# Patient Record
Sex: Male | Born: 1940 | Race: Black or African American | Hispanic: No | Smoking: Never smoker
Health system: Southern US, Community
[De-identification: ages and names within clinical notes are randomized; demographics above are authoritative.]

## PROBLEM LIST (undated history)

## (undated) DIAGNOSIS — E119 Type 2 diabetes mellitus without complications: Secondary | ICD-10-CM

## (undated) DIAGNOSIS — Z8546 Personal history of malignant neoplasm of prostate: Secondary | ICD-10-CM

## (undated) DIAGNOSIS — M549 Dorsalgia, unspecified: Secondary | ICD-10-CM

## (undated) DIAGNOSIS — I421 Obstructive hypertrophic cardiomyopathy: Secondary | ICD-10-CM

## (undated) DIAGNOSIS — I639 Cerebral infarction, unspecified: Secondary | ICD-10-CM

## (undated) DIAGNOSIS — I10 Essential (primary) hypertension: Secondary | ICD-10-CM

## (undated) DIAGNOSIS — L89159 Pressure ulcer of sacral region, unspecified stage: Secondary | ICD-10-CM

## (undated) DIAGNOSIS — F0390 Unspecified dementia without behavioral disturbance: Secondary | ICD-10-CM

## (undated) DIAGNOSIS — C801 Malignant (primary) neoplasm, unspecified: Secondary | ICD-10-CM

## (undated) DIAGNOSIS — J45909 Unspecified asthma, uncomplicated: Secondary | ICD-10-CM

## (undated) DIAGNOSIS — G8929 Other chronic pain: Secondary | ICD-10-CM

## (undated) HISTORY — DX: Pressure ulcer of sacral region, unspecified stage: L89.159

---

## 2013-02-15 DIAGNOSIS — I639 Cerebral infarction, unspecified: Secondary | ICD-10-CM

## 2013-02-15 HISTORY — DX: Cerebral infarction, unspecified: I63.9

## 2013-04-03 ENCOUNTER — Inpatient Hospital Stay (HOSPITAL_COMMUNITY)
Admission: EM | Admit: 2013-04-03 | Discharge: 2013-04-08 | DRG: 690 | Disposition: A | Discharge status: Skilled Nursing Facility | Payer: Medicare Other | Attending: Internal Medicine | Admitting: Internal Medicine

## 2013-04-03 ENCOUNTER — Emergency Department (HOSPITAL_COMMUNITY): Payer: Medicare Other

## 2013-04-03 ENCOUNTER — Encounter (HOSPITAL_COMMUNITY): Payer: Self-pay | Admitting: Emergency Medicine

## 2013-04-03 ENCOUNTER — Emergency Department (HOSPITAL_COMMUNITY)
Admission: EM | Admit: 2013-04-03 | Discharge: 2013-04-03 | Discharge status: Absent without leave | Payer: Medicare Other | Source: Home / Self Care | Attending: Emergency Medicine | Admitting: Emergency Medicine

## 2013-04-03 DIAGNOSIS — I10 Essential (primary) hypertension: Secondary | ICD-10-CM | POA: Diagnosis present

## 2013-04-03 DIAGNOSIS — N390 Urinary tract infection, site not specified: Principal | ICD-10-CM | POA: Diagnosis present

## 2013-04-03 DIAGNOSIS — M25569 Pain in unspecified knee: Secondary | ICD-10-CM | POA: Diagnosis present

## 2013-04-03 DIAGNOSIS — Z79899 Other long term (current) drug therapy: Secondary | ICD-10-CM

## 2013-04-03 DIAGNOSIS — F0390 Unspecified dementia without behavioral disturbance: Secondary | ICD-10-CM | POA: Diagnosis present

## 2013-04-03 DIAGNOSIS — M160 Bilateral primary osteoarthritis of hip: Secondary | ICD-10-CM

## 2013-04-03 DIAGNOSIS — R011 Cardiac murmur, unspecified: Secondary | ICD-10-CM | POA: Diagnosis present

## 2013-04-03 DIAGNOSIS — R32 Unspecified urinary incontinence: Secondary | ICD-10-CM | POA: Diagnosis present

## 2013-04-03 DIAGNOSIS — E860 Dehydration: Secondary | ICD-10-CM | POA: Diagnosis present

## 2013-04-03 DIAGNOSIS — I69920 Aphasia following unspecified cerebrovascular disease: Secondary | ICD-10-CM

## 2013-04-03 DIAGNOSIS — Z23 Encounter for immunization: Secondary | ICD-10-CM

## 2013-04-03 DIAGNOSIS — E876 Hypokalemia: Secondary | ICD-10-CM

## 2013-04-03 DIAGNOSIS — B9689 Other specified bacterial agents as the cause of diseases classified elsewhere: Secondary | ICD-10-CM | POA: Diagnosis present

## 2013-04-03 DIAGNOSIS — Z794 Long term (current) use of insulin: Secondary | ICD-10-CM

## 2013-04-03 DIAGNOSIS — Z8673 Personal history of transient ischemic attack (TIA), and cerebral infarction without residual deficits: Secondary | ICD-10-CM

## 2013-04-03 DIAGNOSIS — E119 Type 2 diabetes mellitus without complications: Secondary | ICD-10-CM | POA: Diagnosis present

## 2013-04-03 DIAGNOSIS — H18419 Arcus senilis, unspecified eye: Secondary | ICD-10-CM | POA: Diagnosis present

## 2013-04-03 DIAGNOSIS — J45909 Unspecified asthma, uncomplicated: Secondary | ICD-10-CM | POA: Diagnosis present

## 2013-04-03 DIAGNOSIS — Z7982 Long term (current) use of aspirin: Secondary | ICD-10-CM

## 2013-04-03 DIAGNOSIS — Z8546 Personal history of malignant neoplasm of prostate: Secondary | ICD-10-CM

## 2013-04-03 DIAGNOSIS — E7800 Pure hypercholesterolemia, unspecified: Secondary | ICD-10-CM | POA: Diagnosis present

## 2013-04-03 DIAGNOSIS — B351 Tinea unguium: Secondary | ICD-10-CM | POA: Diagnosis present

## 2013-04-03 DIAGNOSIS — Z7902 Long term (current) use of antithrombotics/antiplatelets: Secondary | ICD-10-CM

## 2013-04-03 DIAGNOSIS — M169 Osteoarthritis of hip, unspecified: Secondary | ICD-10-CM | POA: Diagnosis present

## 2013-04-03 DIAGNOSIS — M1610 Unilateral primary osteoarthritis, unspecified hip: Secondary | ICD-10-CM | POA: Diagnosis present

## 2013-04-03 DIAGNOSIS — I872 Venous insufficiency (chronic) (peripheral): Secondary | ICD-10-CM | POA: Diagnosis present

## 2013-04-03 DIAGNOSIS — E785 Hyperlipidemia, unspecified: Secondary | ICD-10-CM | POA: Diagnosis present

## 2013-04-03 DIAGNOSIS — R4182 Altered mental status, unspecified: Secondary | ICD-10-CM

## 2013-04-03 DIAGNOSIS — R269 Unspecified abnormalities of gait and mobility: Secondary | ICD-10-CM | POA: Diagnosis present

## 2013-04-03 HISTORY — DX: Personal history of malignant neoplasm of prostate: Z85.46

## 2013-04-03 HISTORY — DX: Dorsalgia, unspecified: M54.9

## 2013-04-03 HISTORY — DX: Essential (primary) hypertension: I10

## 2013-04-03 HISTORY — DX: Obstructive hypertrophic cardiomyopathy: I42.1

## 2013-04-03 HISTORY — DX: Unspecified dementia, unspecified severity, without behavioral disturbance, psychotic disturbance, mood disturbance, and anxiety: F03.90

## 2013-04-03 HISTORY — DX: Unspecified asthma, uncomplicated: J45.909

## 2013-04-03 HISTORY — DX: Cerebral infarction, unspecified: I63.9

## 2013-04-03 HISTORY — DX: Other chronic pain: G89.29

## 2013-04-03 HISTORY — DX: Type 2 diabetes mellitus without complications: E11.9

## 2013-04-03 LAB — URINE MICROSCOPIC-ADD ON

## 2013-04-03 LAB — URINALYSIS, ROUTINE W REFLEX MICROSCOPIC
Bilirubin Urine: NEGATIVE
Glucose, UA: 100 mg/dL — AB
Glucose, UA: NEGATIVE mg/dL
Hgb urine dipstick: NEGATIVE
Protein, ur: NEGATIVE mg/dL
Specific Gravity, Urine: 1.022 (ref 1.005–1.030)
pH: 8.5 — ABNORMAL HIGH (ref 5.0–8.0)

## 2013-04-03 LAB — CBC WITH DIFFERENTIAL/PLATELET
Basophils Absolute: 0 10*3/uL (ref 0.0–0.1)
Lymphocytes Relative: 37 % (ref 12–46)
Lymphs Abs: 2.3 10*3/uL (ref 0.7–4.0)
MCV: 88.6 fL (ref 78.0–100.0)
Neutro Abs: 3.3 10*3/uL (ref 1.7–7.7)
Neutrophils Relative %: 52 % (ref 43–77)
Platelets: 180 10*3/uL (ref 150–400)
RBC: 4.49 MIL/uL (ref 4.22–5.81)
RDW: 12.4 % (ref 11.5–15.5)
WBC: 6.4 10*3/uL (ref 4.0–10.5)

## 2013-04-03 LAB — GLUCOSE, CAPILLARY

## 2013-04-03 LAB — COMPREHENSIVE METABOLIC PANEL
ALT: 91 U/L — ABNORMAL HIGH (ref 0–53)
AST: 107 U/L — ABNORMAL HIGH (ref 0–37)
Alkaline Phosphatase: 74 U/L (ref 39–117)
CO2: 29 mEq/L (ref 19–32)
Calcium: 9.2 mg/dL (ref 8.4–10.5)
Chloride: 102 mEq/L (ref 96–112)
GFR calc Af Amer: >90 mL/min (ref >90)
GFR calc non Af Amer: >90 mL/min (ref >90)
Glucose, Bld: 142 mg/dL — ABNORMAL HIGH (ref 70–99)
Potassium: 3.1 mEq/L — ABNORMAL LOW (ref 3.5–5.1)
Sodium: 139 mEq/L (ref 135–145)
Total Bilirubin: 0.5 mg/dL (ref 0.3–1.2)

## 2013-04-03 MED ORDER — TAMSULOSIN HCL 0.4 MG PO CAPS
0.4000 mg | ORAL_CAPSULE | Freq: Every day | ORAL | Status: DC
  Administered 2013-04-04 – 2013-04-07 (×4): 0.4 mg via ORAL
  Filled 2013-04-03 (×5): qty 1

## 2013-04-03 MED ORDER — DEXTROSE 5 % IV SOLN: 1.0000 g | INTRAVENOUS | Status: DC

## 2013-04-03 MED ORDER — DEXTROSE 5 % IV SOLN
1.0000 g | INTRAVENOUS | Status: DC
  Administered 2013-04-03: 1 g via INTRAVENOUS
  Filled 2013-04-03: qty 10

## 2013-04-03 MED ORDER — IRBESARTAN 300 MG PO TABS
300.0000 mg | ORAL_TABLET | Freq: Every day | ORAL | Status: DC
  Administered 2013-04-04 – 2013-04-08 (×5): 300 mg via ORAL
  Filled 2013-04-03 (×5): qty 1

## 2013-04-03 MED ORDER — POTASSIUM CHLORIDE CRYS ER 20 MEQ PO TBCR
40.0000 meq | EXTENDED_RELEASE_TABLET | Freq: Once | ORAL | Status: AC
  Administered 2013-04-03: 40 meq via ORAL
  Filled 2013-04-03: qty 2

## 2013-04-03 MED ORDER — SODIUM CHLORIDE 0.9 % IV SOLN
INTRAVENOUS | Status: DC
  Administered 2013-04-03 – 2013-04-05 (×5): via INTRAVENOUS

## 2013-04-03 MED ORDER — SODIUM CHLORIDE 0.9 % IJ SOLN
3.0000 mL | Freq: Two times a day (BID) | INTRAMUSCULAR | Status: DC
  Administered 2013-04-05 – 2013-04-06 (×2): 3 mL via INTRAVENOUS

## 2013-04-03 MED ORDER — ALBUTEROL SULFATE HFA 108 (90 BASE) MCG/ACT IN AERS
2.0000 | INHALATION_SPRAY | Freq: Four times a day (QID) | RESPIRATORY_TRACT | Status: DC | PRN
Start: 2013-04-03 — End: 2013-04-08
  Filled 2013-04-03: qty 6.7

## 2013-04-03 MED ORDER — CLOPIDOGREL BISULFATE 75 MG PO TABS
75.0000 mg | ORAL_TABLET | Freq: Every day | ORAL | Status: DC
  Administered 2013-04-04 – 2013-04-08 (×5): 75 mg via ORAL
  Filled 2013-04-03 (×5): qty 1

## 2013-04-03 MED ORDER — INSULIN ASPART 100 UNIT/ML ~~LOC~~ SOLN
0.0000 [IU] | Freq: Three times a day (TID) | SUBCUTANEOUS | Status: DC
  Administered 2013-04-04 – 2013-04-05 (×4): 2 [IU] via SUBCUTANEOUS
  Administered 2013-04-05 (×2): 3 [IU] via SUBCUTANEOUS
  Administered 2013-04-06: 2 [IU] via SUBCUTANEOUS
  Administered 2013-04-06: 3 [IU] via SUBCUTANEOUS
  Administered 2013-04-06: 2 [IU] via SUBCUTANEOUS
  Administered 2013-04-07: 3 [IU] via SUBCUTANEOUS
  Administered 2013-04-07 – 2013-04-08 (×3): 2 [IU] via SUBCUTANEOUS
  Administered 2013-04-08: 3 [IU] via SUBCUTANEOUS

## 2013-04-03 MED ORDER — AMLODIPINE BESYLATE 10 MG PO TABS
10.0000 mg | ORAL_TABLET | Freq: Every day | ORAL | Status: DC
  Administered 2013-04-03 – 2013-04-08 (×6): 10 mg via ORAL
  Filled 2013-04-03 (×6): qty 1

## 2013-04-03 MED ORDER — POTASSIUM CHLORIDE 10 MEQ/100ML IV SOLN
10.0000 meq | Freq: Once | INTRAVENOUS | Status: AC
  Administered 2013-04-03: 10 meq via INTRAVENOUS
  Filled 2013-04-03: qty 100

## 2013-04-03 MED ORDER — ATORVASTATIN CALCIUM 40 MG PO TABS
40.0000 mg | ORAL_TABLET | Freq: Every day | ORAL | Status: DC
  Administered 2013-04-04 – 2013-04-08 (×5): 40 mg via ORAL
  Filled 2013-04-03 (×5): qty 1

## 2013-04-03 MED ORDER — ENOXAPARIN SODIUM 40 MG/0.4ML ~~LOC~~ SOLN
40.0000 mg | SUBCUTANEOUS | Status: DC
  Administered 2013-04-03 – 2013-04-07 (×5): 40 mg via SUBCUTANEOUS
  Filled 2013-04-03 (×6): qty 0.4

## 2013-04-03 MED ORDER — ASPIRIN EC 81 MG PO TBEC
81.0000 mg | DELAYED_RELEASE_TABLET | Freq: Every day | ORAL | Status: DC
  Administered 2013-04-04 – 2013-04-08 (×5): 81 mg via ORAL
  Filled 2013-04-03 (×5): qty 1

## 2013-04-03 NOTE — ED Notes (Signed)
Floor called to give report, we'll call again to give report. Receiving RN getting another report.

## 2013-04-03 NOTE — H&P (Signed)
[facility] Note Date: 04/03/2013  Patient name: Jerry Stanley Medical record number: [id number] Date of birth: 09-10-1941 Age: 72 y.o. Gender: male PCP: Default, Provider, MD  Medical Service: Internal Medicine Teaching Service  Attending physician:  Blanch Media, MD      Chief Complaint: urinary incontinence  History of Present Illness: Patient is a 72 year old with past medical history significant for stroke in March 2014, diabetes mellitus, HTN, and hypercholesterolemia, who presents with urinary incontinence. History is provided by the sister and is limited, as sister had not been in contact with patient for years. After patient's stroke in March, he was discharged from rehab to the care of his girlfriend and girlfriend's daughter in [place], [place]. The functional status of the patient after his stroke is unknown. The patient's sister recently brought the patient from [place], [place] because he was not being properly cared for. Sister states that when she found him, he was incontinent of urine and stool (of unknown duration), and he had trouble walking--very slow pace, but not wobbly. She noted the patient seemed moderately confused, but he is able to talk. She presumes that the patient was not getting any of his medications, as they were not filled per pharmacy's records after discharge from rehab. Since two days ago when he came under her care, the patient has been complaining of left knee and leg pain. He has been sweating (questionable fevers and chills). He has been eating and drinking appropriately for the last two days.   Meds: Current Outpatient Rx  Name  Route  Sig  Dispense  Refill  . albuterol (PROVENTIL HFA;VENTOLIN HFA) 108 (90 BASE) MCG/ACT inhaler   Inhalation   Inhale 2 puffs into the lungs every 6 (six) hours as needed for wheezing.         Marland Kitchen amLODipine (NORVASC) 10 MG tablet   Oral   Take 10 mg by mouth daily.         Marland Kitchen aspirin 325 MG EC  tablet   Oral   Take 325 mg by mouth daily.         Marland Kitchen atorvastatin (LIPITOR) 40 MG tablet   Oral   Take 40 mg by mouth daily.         . clopidogrel (PLAVIX) 75 MG tablet   Oral   Take 75 mg by mouth daily.         . insulin lispro (HUMALOG) 100 UNIT/ML injection   Subcutaneous   Inject 10 Units into the skin 3 (three) times daily before meals.         . metFORMIN (GLUCOPHAGE) 500 MG tablet   Oral   Take 500 mg by mouth 2 (two) times daily with a meal.         . saxagliptin HCl (ONGLYZA) 2.5 MG TABS tablet   Oral   Take 2.5 mg by mouth daily.         . tamsulosin (FLOMAX) 0.4 MG CAPS   Oral   Take by mouth.         . valsartan (DIOVAN) 160 MG tablet   Oral   Take 160 mg by mouth daily.           Allergies: Allergies as of 04/03/2013  . (No Known Allergies)   Past Medical History  Diagnosis Date  . Stroke 02/2013  . Diabetes mellitus without complication    History reviewed. No pertinent past surgical history. History reviewed. No pertinent family history. History  Social History  . Marital Status: Widowed    Spouse Name: N/A    Number of Children: N/A  . Years of Education: N/A   Occupational History  . Not on file.   Social History Main Topics  . Smoking status: Never Smoker   . Smokeless tobacco: Not on file  . Alcohol Use: No  . Drug Use: No  . Sexually Active: Not on file   Other Topics Concern  . Not on file   Social History Narrative  . No narrative on file    Review of Systems: Pertinent items are noted in HPI.  Physical Exam: Blood pressure 145/73, pulse 71, temperature 97.9 F (36.6 C), temperature source Oral, resp. rate 12, SpO2 100.00%. General: somnolent but arousable, minimally cooperative, in no apparent distress, temporal and orbital wasting HEENT: vision grossly intact, exam limited by patient minimally able to open eyes and mouth Neck: supple, no JVD Lungs: clear to ascultation bilaterally, exam limited by  poor work of breathing Heart: regular rate and rhythm, 2/6 systolic murmur heard most prominently at apex Abdomen: soft, non-tender, non-distended Extremities: 2+ DP pulses bilaterally; no cyanosis, clubbing, or edema Skin: dry, flaky skin on lower extremities, onychomycosis on fingers and all toes Neurologic: somnolent but arousable, strength grossly reduced, sensation light touch grossly intact--exam limited as patient was minimally cooperative and difficult to arouse; gait examination deferred until patient is arousable  Lab results: Basic Metabolic Panel:  Recent Labs  16/10/96 1243  NA 139  K 3.1*  CL 102  CO2 29  GLUCOSE 142*  BUN 14  CREATININE 0.73  CALCIUM 9.2   Liver Function Tests:  Recent Labs  04/03/13 1243  AST 107*  ALT 91*  ALKPHOS 74  BILITOT 0.5  PROT 8.0  ALBUMIN 3.1*   No results found for this basename: LIPASE, AMYLASE,  in the last 72 hours No results found for this basename: AMMONIA,  in the last 72 hours CBC:  Recent Labs  04/03/13 1243  WBC 6.4  NEUTROABS 3.3  HGB 14.4  HCT 39.8  MCV 88.6  PLT 180   Cardiac Enzymes:  Recent Labs  04/03/13 1243  CKTOTAL 136   Urinalysis: pending  Imaging results:  Dg Chest 1 View  04/03/2013  *RADIOLOGY REPORT*  Clinical Data: Fall.  Left knee pain.  CHEST - 1 VIEW  Comparison: None.  Findings: Lungs are clear.  No pneumothorax or pleural fluid. Heart size is upper normal. No focal bony abnormality.  IMPRESSION: No acute disease.   Original Report Authenticated By: Holley Dexter, M.D.    Dg Hip Complete Left  04/03/2013  *RADIOLOGY REPORT*  Clinical Data: Left hip pain status post fall.  LEFT HIP - COMPLETE 2+ VIEW  Comparison: None.  Findings: The bones are diffusely demineralized.  There is advanced left hip arthropathy with superolateral migration and flattening of the femoral head.  There are multiple subchondral cysts.  There is lesser right hip joint space loss and subchondral cyst  formation. The right femoral head demonstrates some subchondral sclerosis which could be degenerative or secondary to avascular necrosis. There is no evidence of acute fracture or dislocation.  IMPRESSION:  1.  Bilateral hip arthropathy, worse on the left where it is advanced. 2.  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  Although there is some flattening of the left femoral head, it does not show definite signs of underlying avascular necrosis. 3.  No evidence of acute fracture or dislocation.   Original  Report Authenticated By: Carey Bullocks, M.D.    Ct Head Wo Contrast  04/03/2013  *RADIOLOGY REPORT*  Clinical Data: Dementia.  Gait disturbance.  CT HEAD WITHOUT CONTRAST  Technique:  Contiguous axial images were obtained from the base of the skull through the vertex without contrast.  Comparison: None.  Findings: No skull fracture or intracranial hemorrhage.  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  Small vessel disease type changes.  No CT evidence of large acute infarct.  Mild global atrophy without hydrocephalus.  No intracranial mass lesion detected on this unenhanced exam.  Vascular calcifications.  Mastoid air cells, middle ear cavities and visualized paranasal sinuses are clear.  Orbital structures unremarkable.  IMPRESSION: No skull fracture or intracranial hemorrhage.  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  Small vessel disease type changes.  No CT evidence of large acute infarct.  Mild global atrophy without hydrocephalus.   Original Report Authenticated By: Lacy Duverney, M.D.    Dg Knee Complete 4 Views Left  04/03/2013  *RADIOLOGY REPORT*  Clinical Data: Knee pain status post fall.  LEFT KNEE - COMPLETE 4+ VIEW  Comparison: None.  Findings: The bones are demineralized.  There is minimal tricompartmental joint space loss for age with meniscal chondrocalcinosis medially.  There is no evidence of acute fracture, dislocation or  significant knee joint effusion.  IMPRESSION: No acute osseous findings.   Original Report Authenticated By: Carey Bullocks, M.D.    Other results: EKG: pending  Assessment & Plan: Patient is a 72 year old with past medical history significant for stroke in March 2014, diabetes mellitus, HTN, and hypercholesterolemia, who presents with urinary incontinence.   1. Possible UTI: Patient has had urinary incontinence of unknown duration. Urinalysis was performed in ED, pending. Patient received a dose of Rocephin in ED. -Admit to tele -Urinalysis pending -Order urine culture -CBC in am  2. Urinary incontinence: It is unknown when the patient's urinary incontinence started, whether after stroke or more recently. Also, uncertain whether stress vs. overflow incontinence. Possible sources of urinary incontinence are infection/UTI, neurologic/result of recent stroke, or prostatitis. Given that patient is also incontinent of stool (per sister's report), neurological source is likely. Will workup for UTI as infection could also be present. -Bladder scan to check for post-void residual  3. Possible altered mental status: It is uncertain whether the patient's change in mental status is acute or chronic. Potential etiologies include acute stroke or hemorrhage, prior stroke, dementia, UTI, or normal pressure hydrocephalus. CT head does not indicate intracranial hemorrhage and does not suggest stroke, although TIAs cannot be ruled out. CT head shows mild global atrophy that could be consistent with dementia, but time course of mental status changes needs to be elucidated. NPH is possible with incontinence, possible altered mental status, but patient does not have wobbly gait. CT head is not consistent with NPH. -Monitor mental status for changes  4. Hypertension, uncontrolled: Patient's BP in the ED 140-150s/70s. Patient has likely been off of BP medications of Diovan and amlodipine.  -Start Diovan and  amlodipine  5. History of stroke: Patient has history of two strokes, most recent in March 2014. His stroke risk factors include diabetes, HTN, and prior stroke. Will restart home aspirin and Plavix for prevention of future strokes. Stroke risk factor work-up needed as outpatient. -Start ASA 81mg  daily -Restart home Plavix 75mg  daily  7. Diabetes mellitus type 2: Patient's diabetes status is unknown. He presumably has been off medications since March. Has has home  medications of Humalog (?dosage), metformin 500mg  bid, and Onglyza 5mg  daily. -Monitor BGs -Check HgbA1C -Sliding scale insulin  6. Hyperlipidemia: Patient will need assessment (lipid panel) as outpatient. -Restart home statin  7. DVT ppx: Lovenox  8. F/E/N: -NS 125 -Received K-dur 40 mEq, KCL 10 mEq IV once in ED -BMET in am, monitor K -Normal diet  9. Dispo: Disposition is deferred at this time, awaiting improvement of current medical problems. Anticipated discharge in approximately 3-4 day(s). Will likely need SNF placement.  The patient does not have a current PCP, therefore will be requiring OCP follow-up after discharge.   The patient does not have transportation limitations that hinder transportation to clinic appointments.  This is a Psychologist, occupational Note.  The care of the patient was discussed with Dr. Dorise Hiss and the assessment and plan was formulated with their assistance.  Please see their note for official documentation of the patient encounter.   SignedCheryl Flash 04/03/2013, 5:13 PM

## 2013-04-03 NOTE — ED Notes (Signed)
Patient transported to X-ray 
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
## 2013-04-03 NOTE — ED Provider Notes (Signed)
History     CSN: [id number]  Arrival date & time 04/03/13  1050   First MD Initiated Contact with Patient 04/03/13 1232      Chief Complaint  Patient presents with  . Knee Pain  . Urinary Tract Infection    (Consider location/radiation/quality/duration/timing/severity/associated sxs/prior treatment) The history is provided by a relative. The history is limited by the condition of the patient (Confusion).  72 year old male is brought here by his sister to state that she got him from a facility that had not been taking care of him well. She's had them for several days. She is noted that he is incontinent of urine. He has had problems with urinary incontinence since his stroke about 5 weeks ago. He is also not able to walk. He is complaining of pain in his left knee and she's noted a black area on his left heel. Patient does complain of pain in his left knee and he points to the medial aspect the knee but I cannot get any other relevant information from him. His sister states that she is not able to take care of them and wishes assistance in placement for nursing home. She does not know what was affected by his stroke but does know that he was discharged from hospital in [place] to a rehabilitation center and [facility] and then to the facility where she picked him up. She says that his mental state has clearly changed from prior to the stroke 2 after the stroke. He has confusion now which he did not have before.  Past Medical History  Diagnosis Date  . Stroke 02/2013  . Diabetes mellitus without complication     History reviewed. No pertinent past surgical history.  History reviewed. No pertinent family history.  History  Substance Use Topics  . Smoking status: Never Smoker   . Smokeless tobacco: Not on file  . Alcohol Use: No      Review of Systems  Unable to perform ROS: Mental status change    Allergies  Review of patient's allergies indicates  no known allergies.  Home Medications   Current Outpatient Rx  Name  Route  Sig  Dispense  Refill  . albuterol (PROVENTIL HFA;VENTOLIN HFA) 108 (90 BASE) MCG/ACT inhaler   Inhalation   Inhale 2 puffs into the lungs every 6 (six) hours as needed for wheezing.         Marland Kitchen amLODipine (NORVASC) 10 MG tablet   Oral   Take 10 mg by mouth daily.         Marland Kitchen aspirin 325 MG EC tablet   Oral   Take 325 mg by mouth daily.         Marland Kitchen atorvastatin (LIPITOR) 40 MG tablet   Oral   Take 40 mg by mouth daily.         . clopidogrel (PLAVIX) 75 MG tablet   Oral   Take 75 mg by mouth daily.         . insulin lispro (HUMALOG) 100 UNIT/ML injection   Subcutaneous   Inject 10 Units into the skin 3 (three) times daily before meals.         . metFORMIN (GLUCOPHAGE) 500 MG tablet   Oral   Take 500 mg by mouth 2 (two) times daily with a meal.         . saxagliptin HCl (ONGLYZA) 2.5 MG TABS tablet   Oral   Take 2.5 mg by mouth daily.         Marland Kitchen  tamsulosin (FLOMAX) 0.4 MG CAPS   Oral   Take by mouth.         . valsartan (DIOVAN) 160 MG tablet   Oral   Take 160 mg by mouth daily.           BP 143/70  Pulse 69  Temp(Src) 98.3 F (36.8 C) (Oral)  Resp 16  SpO2 100%  Physical Exam  Nursing note and vitals reviewed.  72 year old male, resting comfortably and in no acute distress. Vital signs are normal. Oxygen saturation is 94%, which is normal. Head is normocephalic and atraumatic. PERRLA, EOMI. Arcus senilis is present. Oropharynx is clear. Neck is nontender and supple without adenopathy or JVD. There no carotid bruits. Back is nontender and there is no CVA tenderness. Lungs are clear without rales, wheezes, or rhonchi. Chest is nontender. Heart has regular rate and rhythm with 2/6 holosystolic murmur at the cardiac base with radiation to the apex. Abdomen is soft, flat, nontender without masses or hepatosplenomegaly and peristalsis is normoactive. Extremities have no  cyanosis or edema, full range of motion is present. Mild venous stasis changes are present. There is a shallow vesicle present on the left heel. There is mild tenderness to palpation in the left knee in the medial aspect but there is a moderate tenderness palpation or lateral aspect of the left hip. There is no instability of the left knee but there is pain with external rotation of the left hip. There is no effusion of the left knee. Skin is warm and dry without rash. Neurologic: He is awake, alert, oriented to person and partly oriented to place( in a hospital but thinks he is in [facility]) but not oriented to time, cranial nerves are intact, there are no gross motor or sensory deficits. ED Course  Procedures (including critical care time)  Results for orders placed during the hospital encounter of 04/03/13  CBC WITH DIFFERENTIAL      Result Value Range   WBC 6.4  4.0 - 10.5 K/uL   RBC 4.49  4.22 - 5.81 MIL/uL   Hemoglobin 14.4  13.0 - 17.0 g/dL   HCT 46.9  62.9 - 52.8 %   MCV 88.6  78.0 - 100.0 fL   MCH 32.1  26.0 - 34.0 pg   MCHC 36.2 (*) 30.0 - 36.0 g/dL   RDW 41.3  24.4 - 01.0 %   Platelets 180  150 - 400 K/uL   Neutrophils Relative 52  43 - 77 %   Neutro Abs 3.3  1.7 - 7.7 K/uL   Lymphocytes Relative 37  12 - 46 %   Lymphs Abs 2.3  0.7 - 4.0 K/uL   Monocytes Relative 11  3 - 12 %   Monocytes Absolute 0.7  0.1 - 1.0 K/uL   Eosinophils Relative 0  0 - 5 %   Eosinophils Absolute 0.0  0.0 - 0.7 K/uL   Basophils Relative 0  0 - 1 %   Basophils Absolute 0.0  0.0 - 0.1 K/uL  COMPREHENSIVE METABOLIC PANEL      Result Value Range   Sodium 139  135 - 145 mEq/L   Potassium 3.1 (*) 3.5 - 5.1 mEq/L   Chloride 102  96 - 112 mEq/L   CO2 29  19 - 32 mEq/L   Glucose, Bld 142 (*) 70 - 99 mg/dL   BUN 14  6 - 23 mg/dL   Creatinine, Ser 2.72  0.50 - 1.35 mg/dL   Calcium  9.2  8.4 - 10.5 mg/dL   Total Protein 8.0  6.0 - 8.3 g/dL   Albumin 3.1 (*) 3.5 - 5.2 g/dL   AST 161 (*) 0 - 37 U/L    ALT 91 (*) 0 - 53 U/L   Alkaline Phosphatase 74  39 - 117 U/L   Total Bilirubin 0.5  0.3 - 1.2 mg/dL   GFR calc non Af Amer >90  >90 mL/min   GFR calc Af Amer >90  >90 mL/min  CK      Result Value Range   Total CK 136  7 - 232 U/L   Dg Chest 1 View  04/03/2013  *RADIOLOGY REPORT*  Clinical Data: Fall.  Left knee pain.  CHEST - 1 VIEW  Comparison: None.  Findings: Lungs are clear.  No pneumothorax or pleural fluid. Heart size is upper normal. No focal bony abnormality.  IMPRESSION: No acute disease.   Original Report Authenticated By: Holley Dexter, M.D.    Dg Hip Complete Left  04/03/2013  *RADIOLOGY REPORT*  Clinical Data: Left hip pain status post fall.  LEFT HIP - COMPLETE 2+ VIEW  Comparison: None.  Findings: The bones are diffusely demineralized.  There is advanced left hip arthropathy with superolateral migration and flattening of the femoral head.  There are multiple subchondral cysts.  There is lesser right hip joint space loss and subchondral cyst formation. The right femoral head demonstrates some subchondral sclerosis which could be degenerative or secondary to avascular necrosis. There is no evidence of acute fracture or dislocation.  IMPRESSION:  1.  Bilateral hip arthropathy, worse on the left where it is advanced. 2.  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  Although there is some flattening of the left femoral head, it does not show definite signs of underlying avascular necrosis. 3.  No evidence of acute fracture or dislocation.   Original Report Authenticated By: Carey Bullocks, M.D.    Ct Head Wo Contrast  04/03/2013  *RADIOLOGY REPORT*  Clinical Data: Dementia.  Gait disturbance.  CT HEAD WITHOUT CONTRAST  Technique:  Contiguous axial images were obtained from the base of the skull through the vertex without contrast.  Comparison: None.  Findings: No skull fracture or intracranial hemorrhage.  Remote medial left frontal lobe infarct with  encephalomalacia.  Small vessel disease type changes.  No CT evidence of large acute infarct.  Mild global atrophy without hydrocephalus.  No intracranial mass lesion detected on this unenhanced exam.  Vascular calcifications.  Mastoid air cells, middle ear cavities and visualized paranasal sinuses are clear.  Orbital structures unremarkable.  IMPRESSION: No skull fracture or intracranial hemorrhage.  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  Small vessel disease type changes.  No CT evidence of large acute infarct.  Mild global atrophy without hydrocephalus.   Original Report Authenticated By: Lacy Duverney, M.D.    Dg Knee Complete 4 Views Left  04/03/2013  *RADIOLOGY REPORT*  Clinical Data: Knee pain status post fall.  LEFT KNEE - COMPLETE 4+ VIEW  Comparison: None.  Findings: The bones are demineralized.  There is minimal tricompartmental joint space loss for age with meniscal chondrocalcinosis medially.  There is no evidence of acute fracture, dislocation or significant knee joint effusion.  IMPRESSION: No acute osseous findings.   Original Report Authenticated By: Carey Bullocks, M.D.       Date: 04/03/2013  Rate: 76  Rhythm: normal sinus rhythm  QRS Axis: normal  Intervals: normal  ST/T Wave abnormalities: nonspecific  ST/T changes  Conduction Disutrbances:none  Narrative Interpretation: Left ventricular hypertrophy with secondary repolarization changes. No prior ECG available for comparison.  Old EKG Reviewed: none available    1. Urinary tract infection   2. Hypokalemia   3. Elevated transaminase level   4. Osteoarthritis, hip, bilateral   5. Altered mental status       MDM  No old records are available. Constellation of gait disturbance, dementia, and urinary incontinence is suggestive of normal pressure hydrocephalus a CT will need to be obtained. Workup will need to be obtained for dilation possible metabolic causes of decreased function as well as screen  for occult infection such as urinary tract infection. The pain seems mainly referred from the hip but x-rays will be obtained of both the knee and the hip. Social services been consulted to assist in placement issues.  Urinalysis has come back positive for infection and he is given a dose of ceftriaxone. Hypokalemia is noted and is given a dose of oral and intravenous potassium. Transaminases are mildly elevated of uncertain cause. This will need to be evaluated. Case is discussed with resident on call for internal medicine teaching service and they will admit the patient.  Dione Booze, MD 04/03/13 [phone]

## 2013-04-03 NOTE — ED Provider Notes (Signed)
History     CSN: [id number]  Arrival date & time 04/03/13  1050   First MD Initiated Contact with Patient 04/03/13 1127      Chief Complaint  Patient presents with  . Knee Pain  . Urinary Frequency    (Consider location/radiation/quality/duration/timing/severity/associated sxs/prior treatment) Patient is a 72 y.o. male presenting with knee pain and frequency. The history is provided by a relative. The history is limited by the condition of the patient (dementia).  Knee Pain Urinary Frequency   no ED visit today-patient inadvertently registered under this name (patient's brother).  Past Medical History  Diagnosis Date  . Dementia   . Hypertension   . Diabetes mellitus without complication   . Stroke   . BPH (benign prostatic hyperplasia)   . High cholesterol     History reviewed. No pertinent past surgical history.  No family history on file.  History  Substance Use Topics  . Smoking status: Never Smoker   . Smokeless tobacco: Not on file  . Alcohol Use: No      Review of Systems  Unable to perform ROS: Dementia  Genitourinary: Positive for frequency.    Allergies  Review of patient's allergies indicates no known allergies.  Home Medications   Current Outpatient Rx  Name  Route  Sig  Dispense  Refill  . amLODipine (NORVASC) 10 MG tablet   Oral   Take 10 mg by mouth daily.         Marland Kitchen aspirin 325 MG tablet   Oral   Take 325 mg by mouth daily.         Marland Kitchen atorvastatin (LIPITOR) 40 MG tablet   Oral   Take 40 mg by mouth daily.         . clopidogrel (PLAVIX) 75 MG tablet   Oral   Take 75 mg by mouth daily.         . insulin lispro (HUMALOG PEN) 100 UNIT/ML injection   Subcutaneous   Inject into the skin 3 (three) times daily before meals.         . metFORMIN (GLUCOPHAGE) 500 MG tablet   Oral   Take 500 mg by mouth 2 (two) times daily with a meal.         . saxagliptin HCl (ONGLYZA) 2.5 MG TABS tablet   Oral   Take by mouth daily.         . tamsulosin (FLOMAX) 0.4 MG CAPS   Oral   Take by mouth.         . valsartan (DIOVAN) 160 MG tablet   Oral   Take 160 mg by mouth daily.           BP 119/67  Pulse 92  Temp(Src) 98 F (36.7 C) (Oral)  SpO2 94%  Physical Exam  Nursing note and vitals reviewed.    ED Course  Procedures (including critical care time)  Labs Reviewed - No data to display No results found.   No diagnosis found.    MDM          Dione Booze, MD 04/03/13 1300
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
## 2013-04-03 NOTE — ED Notes (Signed)
Patient registered incorrectly prior. Please refer to events for Maximino, Cozzolino WR#[id number] XB#[id number]

## 2013-04-03 NOTE — ED Notes (Signed)
During patient identification prior to xray transport. Patient's sister's husband (present in room) states "thats not his name". Prior to this point patient was identified per protocol, according to ID band on patient, and agreed upon by patient. Charge RN and Registration notified. SZP filed. EDP notified 

## 2013-04-03 NOTE — ED Notes (Signed)
MD at bedside. (Internal Medicine). 
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
## 2013-04-03 NOTE — ED Notes (Addendum)
During patient identification prior to xray transport. Patient's sister's husband (present in room) states "thats not his name". Prior to this point patient was identified per protocol, according to ID band on patient, and agreed upon by patient. Consulting civil engineer and Registration notified. SZP filed. EDP notified

## 2013-04-04 ENCOUNTER — Encounter (HOSPITAL_COMMUNITY): Payer: Self-pay | Admitting: *Deleted

## 2013-04-04 DIAGNOSIS — N390 Urinary tract infection, site not specified: Principal | ICD-10-CM

## 2013-04-04 LAB — GLUCOSE, CAPILLARY
Glucose-Capillary: 163 mg/dL — ABNORMAL HIGH (ref 70–99)
Glucose-Capillary: 214 mg/dL — ABNORMAL HIGH (ref 70–99)

## 2013-04-04 LAB — COMPREHENSIVE METABOLIC PANEL
AST: 72 U/L — ABNORMAL HIGH (ref 0–37)
Alkaline Phosphatase: 63 U/L (ref 39–117)
BUN: 11 mg/dL (ref 6–23)
CO2: 23 mEq/L (ref 19–32)
Chloride: 105 mEq/L (ref 96–112)
Creatinine, Ser: 0.6 mg/dL (ref 0.50–1.35)
GFR calc non Af Amer: >90 mL/min (ref >90)
Potassium: 3.5 mEq/L (ref 3.5–5.1)
Total Bilirubin: 0.5 mg/dL (ref 0.3–1.2)

## 2013-04-04 LAB — CBC
MCH: 31.3 pg (ref 26.0–34.0)
MCV: 88 fL (ref 78.0–100.0)
Platelets: 150 10*3/uL (ref 150–400)
RBC: 4.25 MIL/uL (ref 4.22–5.81)
RDW: 12.5 % (ref 11.5–15.5)
WBC: 5.9 10*3/uL (ref 4.0–10.5)

## 2013-04-04 LAB — HEMOGLOBIN A1C: Hgb A1c MFr Bld: 7.7 % — ABNORMAL HIGH (ref <5.7)

## 2013-04-04 MED ORDER — DEXTROSE 5 % IV SOLN
1.0000 g | INTRAVENOUS | Status: DC
  Administered 2013-04-04 – 2013-04-06 (×3): 1 g via INTRAVENOUS
  Filled 2013-04-04 (×4): qty 10

## 2013-04-04 MED ORDER — PNEUMOCOCCAL VAC POLYVALENT 25 MCG/0.5ML IJ INJ
0.5000 mL | INJECTION | INTRAMUSCULAR | Status: AC
  Administered 2013-04-05: 0.5 mL via INTRAMUSCULAR
  Filled 2013-04-04: qty 0.5

## 2013-04-04 NOTE — Evaluation (Signed)
Physical Therapy Evaluation Patient Details Name: Jerry Stanley MRN: [id number] DOB: 1941/03/11 Today's Date: 04/04/2013 Time: 4098-1191 PT Time Calculation (min): 19 min  PT Assessment / Plan / Recommendation Clinical Impression  Patient is a 72 yo male admitted with UTI, incontinence, and recent h/o CVA with Lt sided weakness.  Patient requires min to mod assist with all mobility/gait.  Will benefit from acute PT to maximize independence prior to discharge.  Recommend SNF at discharge for continued therapy.    PT Assessment  Patient needs continued PT services    Follow Up Recommendations  SNF    Does the patient have the potential to tolerate intense rehabilitation      Barriers to Discharge Decreased caregiver support      Equipment Recommendations  None recommended by PT    Recommendations for Other Services     Frequency Min 3X/week    Precautions / Restrictions Precautions Precautions: Fall Restrictions Weight Bearing Restrictions: No   Pertinent Vitals/Pain       Mobility  Bed Mobility Bed Mobility: Not assessed Transfers Transfers: Sit to Stand;Stand to Sit Sit to Stand: 4: Min assist;With upper extremity assist;With armrests;From chair/3-in-1 Stand to Sit: 4: Min assist;With upper extremity assist;With armrests;To chair/3-in-1 Details for Transfer Assistance: Verbal cues for hand placement.  Instructed patient to scoot to edge of chair before standing - min assist to rise to standing.  Assist for balance.  Verbal cues for hand placement and safety for transfer to sitting. Ambulation/Gait Ambulation/Gait Assistance: 4: Min assist Ambulation Distance (Feet): 64 Feet Assistive device: Rolling walker Ambulation/Gait Assistance Details: Verbal cues to stand upright and stay inside RW.  Patient with step-to gait pattern, with decreased stance on LLE due to instability/weakness, and shorter step with RLE.  Cues to try to take longer step with RLE.  Gait pattern  deteriorated with increased distance/fatigue. Gait Pattern: Step-to pattern;Decreased stance time - left;Decreased step length - right;Trunk flexed;Wide base of support Gait velocity: Slow gait speed    Exercises     PT Diagnosis: Difficulty walking;Abnormality of gait;Altered mental status;Hemiplegia dominant side  PT Problem List: Decreased strength;Decreased activity tolerance;Decreased balance;Decreased mobility;Decreased cognition;Decreased knowledge of use of DME;Decreased safety awareness PT Treatment Interventions: DME instruction;Gait training;Functional mobility training;Balance training;Patient/family education;Cognitive remediation   PT Goals Acute Rehab PT Goals PT Goal Formulation: With patient Time For Goal Achievement: 04/18/13 Potential to Achieve Goals: Good Pt will go Supine/Side to Sit: with supervision;with HOB 0 degrees PT Goal: Supine/Side to Sit - Progress: Goal set today Pt will go Sit to Supine/Side: with supervision;with HOB 0 degrees PT Goal: Sit to Supine/Side - Progress: Goal set today Pt will go Sit to Stand: with supervision;with upper extremity assist PT Goal: Sit to Stand - Progress: Goal set today Pt will go Stand to Sit: with supervision;with upper extremity assist PT Goal: Stand to Sit - Progress: Goal set today Pt will Ambulate: 51 - 150 feet;with supervision;with rolling walker PT Goal: Ambulate - Progress: Goal set today  Visit Information  Last PT Received On: 04/04/13 Assistance Needed: +1    Subjective Data  Subjective: Short responses.  Minimal verbalizations. Patient Stated Goal: None stated   Prior Functioning  Home Living Lives With: Other (Comment) (Sister's house) Available Help at Discharge: Skilled Nursing Facility (Sister feels she cannot provide 24 hour assist) Home Adaptive Equipment: Walker - rolling Prior Function Level of Independence: Independent (Prior to last CVA) Driving: Yes (before last CVA) Vocation:  Retired Dominant Hand: Left    Cognition  Cognition Arousal/Alertness: Awake/alert Behavior During Therapy: Flat affect Overall Cognitive Status: No family/caregiver present to determine baseline cognitive functioning (Patient disoriented to place, time, situation)    Extremity/Trunk Assessment Right Upper Extremity Assessment RUE ROM/Strength/Tone: WFL for tasks assessed RUE Sensation: WFL - Light Touch Left Upper Extremity Assessment LUE ROM/Strength/Tone: Deficits LUE ROM/Strength/Tone Deficits: Strength 4/5; decreased shoulder flexion LUE Sensation: WFL - Light Touch Right Lower Extremity Assessment RLE ROM/Strength/Tone: Deficits RLE ROM/Strength/Tone Deficits: Strength 4/5 RLE Sensation: WFL - Light Touch Left Lower Extremity Assessment LLE ROM/Strength/Tone: Deficits LLE ROM/Strength/Tone Deficits: Strength 4-/5 LLE Sensation: WFL - Light Touch Trunk Assessment Trunk Assessment: Normal   Balance Balance Balance Assessed: Yes Static Sitting Balance Static Sitting - Balance Support: No upper extremity supported;Feet supported Static Sitting - Level of Assistance: 5: Stand by assistance Static Sitting - Comment/# of Minutes: 3 Static Standing Balance Static Standing - Balance Support: Bilateral upper extremity supported Static Standing - Level of Assistance: 5: Stand by assistance Static Standing - Comment/# of Minutes: 2 minutes - patient able to stand with support of RW with standby assist for limited time.  Increased assist as patient initiated movement.  End of Session PT - End of Session Equipment Utilized During Treatment: Gait belt Activity Tolerance: Patient limited by fatigue Patient left: in chair;with call bell/phone within reach Nurse Communication: Mobility status  GP     Vena Austria 04/04/2013, 4:40 PM Durenda Hurt. Renaldo Fiddler, [facility] Acute Rehab Services Pager [phone]

## 2013-04-04 NOTE — Progress Notes (Signed)
Utilization review completed. Kinesha Auten, RN, BSN. 

## 2013-04-04 NOTE — H&P (Signed)
Please see H and P note for details.

## 2013-04-04 NOTE — Progress Notes (Signed)
Subjective: The patient is more cooperative this morning. His sister is again with him and states that he is more like his baseline and may even be at his new baseline since stroke. He has condom cath in place and is urinating through that. The sister feels unable to care for him at this level of care at home. He is still weak but having no acute complaints. His breathing is fine and no chest pain. Denis abdominal pain.   Objective: Vital signs in last 24 hours: Filed Vitals:   04/03/13 1840 04/03/13 1938 04/04/13 0539 04/04/13 0625  BP:  144/80 163/90   Pulse:  71 76 73  Temp:  98.7 F (37.1 C) 99.1 F (37.3 C)   TempSrc:  Oral Oral   Resp: 16 16 16    Height:  5\' 7"  (1.702 m)    Weight:  154 lb 15.7 oz (70.3 kg)    SpO2:  97%  92%   Weight change:   Intake/Output Summary (Last 24 hours) at 04/04/13 1031 Last data filed at 04/04/13 0600  Gross per 24 hour  Intake 1256.25 ml  Output     50 ml  Net 1206.25 ml   Physical Exam:  General: resting in bed, slightly more communicative, understands what we are saying, follows basic commands  HEENT: PERRL, EOMI, no scleral icterus  Cardiac: RRR, no rubs, murmurs or gallops  Pulm: clear to auscultation bilaterally, moving normal volumes of air  Abd: soft, nontender, nondistended, BS present  Ext: warm and well perfused, no pedal edema  Neuro: awake, moves all 4 extremities, globally strength seems decreased but equal, sensation intact globally. No obvious facial droop or focal deficit.  Lab Results: Basic Metabolic Panel:  Recent Labs Lab 04/03/13 1243 04/04/13 0544  NA 139 136  K 3.1* 3.5  CL 102 105  CO2 29 23  GLUCOSE 142* 177*  BUN 14 11  CREATININE 0.73 0.60  CALCIUM 9.2 8.5   Liver Function Tests:  Recent Labs Lab 04/03/13 1243 04/04/13 0544  AST 107* 72*  ALT 91* 70*  ALKPHOS 74 63  BILITOT 0.5 0.5  PROT 8.0 6.8  ALBUMIN 3.1* 2.5*  CBC:  Recent Labs Lab 04/03/13 1243 04/04/13 0544  WBC 6.4 5.9   NEUTROABS 3.3  --   HGB 14.4 13.3  HCT 39.8 37.4*  MCV 88.6 88.0  PLT 180 150   Cardiac Enzymes:  Recent Labs Lab 04/03/13 1243  CKTOTAL 136   CBG:  Recent Labs Lab 04/03/13 2126 04/04/13 0729  GLUCAP 224* 172*   Urinalysis:  Recent Labs Lab 04/03/13 2317  COLORURINE YELLOW  LABSPEC 1.019  PHURINE 6.5  GLUCOSEU NEGATIVE  HGBUR NEGATIVE  BILIRUBINUR NEGATIVE  KETONESUR NEGATIVE  PROTEINUR NEGATIVE  UROBILINOGEN 1.0  NITRITE NEGATIVE  LEUKOCYTESUR LARGE*   Micro Results: No results found for this or any previous visit (from the past 240 hour(s)). Studies/Results: Dg Chest 1 View  04/03/2013  *RADIOLOGY REPORT*  Clinical Data: Fall.  Left knee pain.  CHEST - 1 VIEW  Comparison: None.  Findings: Lungs are clear.  No pneumothorax or pleural fluid. Heart size is upper normal. No focal bony abnormality.  IMPRESSION: No acute disease.   Original Report Authenticated By: Holley Dexter, M.D.    Dg Hip Complete Left  04/03/2013  *RADIOLOGY REPORT*  Clinical Data: Left hip pain status post fall.  LEFT HIP - COMPLETE 2+ VIEW  Comparison: None.  Findings: The bones are diffusely demineralized.  There is advanced left hip  arthropathy with superolateral migration and flattening of the femoral head.  There are multiple subchondral cysts.  There is lesser right hip joint space loss and subchondral cyst formation. The right femoral head demonstrates some subchondral sclerosis which could be degenerative or secondary to avascular necrosis. There is no evidence of acute fracture or dislocation.  IMPRESSION:  1.  Bilateral hip arthropathy, worse on the left where it is advanced. 2.  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  Although there is some flattening of the left femoral head, it does not show definite signs of underlying avascular necrosis. 3.  No evidence of acute fracture or dislocation.   Original Report Authenticated By: Carey Bullocks, M.D.    Ct  Head Wo Contrast  04/03/2013  *RADIOLOGY REPORT*  Clinical Data: Dementia.  Gait disturbance.  CT HEAD WITHOUT CONTRAST  Technique:  Contiguous axial images were obtained from the base of the skull through the vertex without contrast.  Comparison: None.  Findings: No skull fracture or intracranial hemorrhage.  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  Small vessel disease type changes.  No CT evidence of large acute infarct.  Mild global atrophy without hydrocephalus.  No intracranial mass lesion detected on this unenhanced exam.  Vascular calcifications.  Mastoid air cells, middle ear cavities and visualized paranasal sinuses are clear.  Orbital structures unremarkable.  IMPRESSION: No skull fracture or intracranial hemorrhage.  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  Small vessel disease type changes.  No CT evidence of large acute infarct.  Mild global atrophy without hydrocephalus.   Original Report Authenticated By: Lacy Duverney, M.D.    Dg Knee Complete 4 Views Left  04/03/2013  *RADIOLOGY REPORT*  Clinical Data: Knee pain status post fall.  LEFT KNEE - COMPLETE 4+ VIEW  Comparison: None.  Findings: The bones are demineralized.  There is minimal tricompartmental joint space loss for age with meniscal chondrocalcinosis medially.  There is no evidence of acute fracture, dislocation or significant knee joint effusion.  IMPRESSION: No acute osseous findings.   Original Report Authenticated By: Carey Bullocks, M.D.    Medications: I have reviewed the patient's current medications. Scheduled Meds: . amLODipine  10 mg Oral Daily  . aspirin EC  81 mg Oral Daily  . atorvastatin  40 mg Oral Daily  . clopidogrel  75 mg Oral Daily  . enoxaparin (LOVENOX) injection  40 mg Subcutaneous Q24H  . insulin aspart  0-9 Units Subcutaneous TID WC  . irbesartan  300 mg Oral Daily  . [START ON 04/05/2013] pneumococcal 23 valent vaccine  0.5 mL Intramuscular Tomorrow-1000  .  sodium chloride  3 mL Intravenous Q12H  . tamsulosin  0.4 mg Oral QPC supper   Continuous Infusions: . sodium chloride 125 mL/hr at 04/04/13 1030   PRN Meds:.albuterol Assessment/Plan: UTI (lower urinary tract infection) - Repeat U/A shows signs of infection and continue rocpehin IV daily 1g. Clinically looks dehydrated.  -Fluids NS at 125 cc/hr today and reassess -See below for further details  -PT/OT for evaluation  Urinary incontinence - Sounds like functional. The dribbling could be due to overflow incontinence from neurologic causes or just poor mobility in getting to the bathroom. He could also have some BPH (on tamsulosin at home) causing some obstructive cause contributing to this.  -Bladder scan for residual volume  -Encourage toileting with assistance from nursing staff  -Continue tamsulosin  -No obvious medications on his list that would cause retention which could lead to  overflow incontinence  -Urine culture pending  Hypertension - Will keep on home meds norvasc and convert ARB to formulary equivalent. Will monitor closely.   History of stroke - Will keep on ASA and plavix as recent stroke was his 2nd. No known history of A. Fib although sister only knew partial history. On statin and diabetic management. BPs are reasonably controlled and will monitor closely. Mental status change is resolved and back to normal.  -PT/OT consult for functional status   Question of dementia - Unclear baseline as sister has not been around the patient for some time. Will monitor for signs of sundowning. Will try to minimize interventions and needle sticks to minimize risk of delirium while in-patient.   Hyperlipemia - Continue on statin.   Diabetes mellitus type 2 in nonobese - On metformin 500 BID, onglyza 5 mg PO daily, humulin 10 units TID with meals at home.  -Will check HgA1c  -Sugar not elevated or hypoglycemic in ED  -SSI sensitive while in-patient   DVT ppx - lovenox [place]  daily  Dispo: Disposition is deferred at this time, awaiting improvement of current medical problems.  Anticipated discharge in approximately 2-3 day(s).   The patient does have a current PCP (Default, Provider, MD), therefore is not requiring OPC follow-up after discharge.   The patient does not have transportation limitations that hinder transportation to clinic appointments.  .Services Needed at time of discharge: Y = Yes, Blank = No PT:   OT:   RN:   Equipment:   Other:     LOS: 1 day   Genella Mech 04/04/2013, 10:31 AM

## 2013-04-04 NOTE — Evaluation (Signed)
Occupational Therapy Evaluation Patient Details Name: Jerry Stanley MRN: [id number] DOB: 11-15-1941 Today's Date: 04/04/2013 Time: 5643-3295 OT Time Calculation (min): 34 min  OT Assessment / Plan / Recommendation Clinical Impression  Pt is a 72 yr old male from [facility] now here staying with his sister.  Previous recent CVA and discharge from inpatient rehab in [facility] as well.  Admitted to [facility] secondary to UTI and incontinence.  Overall presents at a mod assist level for simulated selfcare tasks and functional transfers.  Will benefit from acute care OT to help increase overall independence however he will need SNF rehab for follow-up secondary to his sister feeling they cannot provide 24 hour care.    OT Assessment  Patient needs continued OT Services    Follow Up Recommendations  SNF    Barriers to Discharge      Equipment Recommendations  None recommended by OT       Frequency  Min 2X/week    Precautions / Restrictions Precautions Precautions: Fall Precaution Comments: painful left knee and hip Restrictions Weight Bearing Restrictions: No   Pertinent Vitals/Pain Pain 2/10 on the faces scale in the left hip with pain    ADL  Eating/Feeding: Simulated;Set up Where Assessed - Eating/Feeding: Chair Grooming: Simulated;Supervision/safety Where Assessed - Grooming: Unsupported sitting Upper Body Bathing: Simulated;Set up Where Assessed - Upper Body Bathing: Unsupported sitting Lower Body Bathing: Simulated;Moderate assistance Where Assessed - Lower Body Bathing: Supported sit to stand Upper Body Dressing: Simulated;Set up Where Assessed - Upper Body Dressing: Unsupported sitting Lower Body Dressing: Simulated;Moderate assistance Where Assessed - Lower Body Dressing: Supported sit to Pharmacist, hospital: Simulated;Moderate assistance Toilet Transfer Method: Stand pivot Toilet Transfer Equipment:  (to bedside chair) Toileting - Clothing Manipulation and Hygiene:  Simulated;Moderate assistance Where Assessed - Toileting Clothing Manipulation and Hygiene: Other (comment) (sit to stand from EOB) Tub/Shower Transfer Method: Not assessed Equipment Used: Gait belt Transfers/Ambulation Related to ADLs: Pt required mod facilitation for stand pivot transfer and taking 2-3 small steps to the bedside chair.  Pt with decreased ability to maintain weightbearing over the LLE with stepping. ADL Comments: Pt with decreased ability to reach the left foot for dressing tasks and needs mod facilitation for balance and sit to stand.  Pt's sister and brother-in-law present and wish to pursue SNF level rehab.  Feel this is his best option for continued rehab and having appropriate 24 hour care.    OT Diagnosis: Generalized weakness;Acute pain;Hemiplegia dominant side  OT Problem List: Decreased strength;Decreased activity tolerance;Impaired balance (sitting and/or standing);Decreased knowledge of use of DME or AE;Pain OT Treatment Interventions: Self-care/ADL training;Therapeutic activities;Neuromuscular education;Patient/family education;Balance training;DME and/or AE instruction   OT Goals Acute Rehab OT Goals OT Goal Formulation: With patient/family Time For Goal Achievement: 04/18/13 Potential to Achieve Goals: Good ADL Goals Pt Will Perform Grooming: with supervision;Standing at sink ADL Goal: Grooming - Progress: Goal set today Pt Will Perform Lower Body Bathing: with supervision;Sit to stand from bed ADL Goal: Lower Body Bathing - Progress: Goal set today Pt Will Perform Lower Body Dressing: with supervision;with adaptive equipment;Sit to stand from bed ADL Goal: Lower Body Dressing - Progress: Goal set today Pt Will Transfer to Toilet: with supervision;with DME;3-in-1 ADL Goal: Toilet Transfer - Progress: Goal set today Pt Will Perform Toileting - Clothing Manipulation: with supervision;Sitting on 3-in-1 or toilet;Standing ADL Goal: Toileting - Clothing  Manipulation - Progress: Goal set today Pt Will Perform Toileting - Hygiene: with supervision;Sit to stand from 3-in-1/toilet ADL Goal: Toileting -  Hygiene - Progress: Goal set today Miscellaneous OT Goals Miscellaneous OT Goal #1: Pt will perform supine to sit EOB in preparation for selfcare tasks with supervision. OT Goal: Miscellaneous Goal #1 - Progress: Goal set today  Visit Information  Last OT Received On: 04/04/13 Assistance Needed: +1    Subjective Data  Subjective: Pt's sister wants him to go to long term rehab. Patient Stated Goal: Did  not state   Prior Functioning     Home Living Lives With: Other (Comment) (sisters house currently) Available Help at Discharge: Available 24 hours/day Type of Home: House Home Access: Stairs to enter [employer] of Steps: 5  Entrance Stairs-Rails: Can reach both;Left;Right Home Layout: One level Bathroom Shower/Tub: Engineer, manufacturing systems: Standard Bathroom Accessibility: Yes How Accessible: Accessible via walker Home Adaptive Equipment: Walker - rolling Prior Function Level of Independence: Independent (Pt was independent prior to the last CVA) Driving: Yes (Before recent CVA) Vocation: Retired Musician: No difficulties Dominant Hand: Left         Vision/Perception Vision - History Baseline Vision: No visual deficits Patient Visual Report: No change from baseline Vision - Assessment Eye Alignment: Within Functional Limits Perception Perception: Within Functional Limits Praxis Praxis: Intact   Cognition  Cognition Arousal/Alertness: Awake/alert Behavior During Therapy: WFL for tasks assessed/performed Overall Cognitive Status: Within Functional Limits for tasks assessed    Extremity/Trunk Assessment Right Upper Extremity Assessment RUE ROM/Strength/Tone: Within functional levels RUE Sensation: WFL - Light Touch RUE Coordination: WFL - gross/fine motor Left Upper  Extremity Assessment LUE ROM/Strength/Tone: Deficits LUE ROM/Strength/Tone Deficits: shoulder flexion 0-100 degrees, all other joints AROM WFLS and strength 4/5 LUE Sensation: WFL - Light Touch LUE Coordination: WFL - gross/fine motor Trunk Assessment Trunk Assessment: Normal     Mobility Bed Mobility Bed Mobility: Supine to Sit Supine to Sit: HOB flat;With rails;3: Mod assist Details for Bed Mobility Assistance: Pt needed assistance with transitioning his trunk to sitting position and scooting forward to the edge of the bed.  Transfers Transfers: Sit to Stand;Stand to Sit Sit to Stand: 3: Mod assist;With upper extremity assist;From bed Stand to Sit: 3: Mod assist;With upper extremity assist;To chair/3-in-1        Balance Balance Balance Assessed: Yes Static Standing Balance Static Standing - Balance Support: Left upper extremity supported;During functional activity Static Standing - Level of Assistance: 4: Min assist Dynamic Standing Balance Dynamic Standing - Balance Support: Left upper extremity supported Dynamic Standing - Level of Assistance: 3: Mod assist   End of Session OT - End of Session Equipment Utilized During Treatment: Gait belt Activity Tolerance: Patient limited by pain;Other (comment) (pain in left hip with weightbearing tasks) Patient left: in chair;with call bell/phone within reach;with family/visitor present Nurse Communication: Mobility status      Lyndel Sarate OTR/L Pager number [phone] 04/04/2013, 12:18 PM

## 2013-04-04 NOTE — Progress Notes (Signed)
Brief Nutrition Note:  RD pulled to pt for malnutrition screening tool report of unintentional weight loss and poor appetite PTA.  Pt did not speak, but nodded yes or no to questions.  Denies any recent weight loss. States his appetite has been good. No other family available at time of RD visit.   No weight hx on file.  Body mass index is 24.27 kg/(m^2). WNL Diet: Carb Mod PO intake: 75% x 2 meals   Chart reviewed, no nutrition interventions warranted at this time. Please consult as needed.   Clarene Duke RD, LDN Pager [phone] After Hours pager [phone]

## 2013-04-04 NOTE — Progress Notes (Signed)
Bladder scan performed 64ml. Condom cath in place

## 2013-04-05 LAB — URINE CULTURE

## 2013-04-05 LAB — GLUCOSE, CAPILLARY
Glucose-Capillary: 166 mg/dL — ABNORMAL HIGH (ref 70–99)
Glucose-Capillary: 214 mg/dL — ABNORMAL HIGH (ref 70–99)
Glucose-Capillary: 222 mg/dL — ABNORMAL HIGH (ref 70–99)

## 2013-04-05 NOTE — Progress Notes (Signed)
Subjective: The patient is more cooperative this morning. His sister is again with him and states that he is more like his baseline. He is up in bed eating lunch vigorously. He has condom cath in place and is urinating through that. The sister feels unable to care for him at this level of care at home. He is still weak but having no acute complaints. His breathing is fine and no chest pain. Denies abdominal pain.   Objective: Vital signs in last 24 hours: Filed Vitals:   04/04/13 1505 04/04/13 2055 04/05/13 0019 04/05/13 0415  BP: 113/68 172/82 135/75 136/78  Pulse: 82 72 72 63  Temp: 98.5 F (36.9 C) 98.8 F (37.1 C)  98.5 F (36.9 C)  TempSrc: Oral Oral  Oral  Resp: 16 18  18   Height:      Weight:      SpO2: 100% 97%  94%   Weight change:   Intake/Output Summary (Last 24 hours) at 04/05/13 0949 Last data filed at 04/05/13 0800  Gross per 24 hour  Intake 3408.75 ml  Output   1101 ml  Net 2307.75 ml   Physical Exam:  General: resting in bed, eating lunch, more communicative, understands what we are saying, follows basic commands  HEENT: PERRL, EOMI, no scleral icterus  Cardiac: RRR, no rubs, murmurs or gallops  Pulm: clear to auscultation bilaterally, moving normal volumes of air  Abd: soft, nontender, nondistended, BS present  Ext: warm and well perfused, no pedal edema  Neuro: awake, moves all 4 extremities, globally strength seems decreased but equal, sensation intact globally. No obvious facial droop or focal deficit.  Lab Results: Basic Metabolic Panel:  Recent Labs Lab 04/03/13 1243 04/04/13 0544  NA 139 136  K 3.1* 3.5  CL 102 105  CO2 29 23  GLUCOSE 142* 177*  BUN 14 11  CREATININE 0.73 0.60  CALCIUM 9.2 8.5   Liver Function Tests:  Recent Labs Lab 04/03/13 1243 04/04/13 0544  AST 107* 72*  ALT 91* 70*  ALKPHOS 74 63  BILITOT 0.5 0.5  PROT 8.0 6.8  ALBUMIN 3.1* 2.5*  CBC:  Recent Labs Lab 04/03/13 1243 04/04/13 0544  WBC 6.4 5.9   NEUTROABS 3.3  --   HGB 14.4 13.3  HCT 39.8 37.4*  MCV 88.6 88.0  PLT 180 150   Cardiac Enzymes:  Recent Labs Lab 04/03/13 1243  CKTOTAL 136   CBG:  Recent Labs Lab 04/03/13 2126 04/04/13 0729 04/04/13 1221 04/04/13 1724 04/04/13 2154 04/05/13 0749  GLUCAP 224* 172* 163* 172* 214* 166*   Urinalysis:  Recent Labs Lab 04/03/13 2317  COLORURINE YELLOW  LABSPEC 1.019  PHURINE 6.5  GLUCOSEU NEGATIVE  HGBUR NEGATIVE  BILIRUBINUR NEGATIVE  KETONESUR NEGATIVE  PROTEINUR NEGATIVE  UROBILINOGEN 1.0  NITRITE NEGATIVE  LEUKOCYTESUR LARGE*   Micro Results: No results found for this or any previous visit (from the past 240 hour(s)). Studies/Results: Dg Chest 1 View  04/03/2013  *RADIOLOGY REPORT*  Clinical Data: Fall.  Left knee pain.  CHEST - 1 VIEW  Comparison: None.  Findings: Lungs are clear.  No pneumothorax or pleural fluid. Heart size is upper normal. No focal bony abnormality.  IMPRESSION: No acute disease.   Original Report Authenticated By: Holley Dexter, M.D.    Dg Hip Complete Left  04/03/2013  *RADIOLOGY REPORT*  Clinical Data: Left hip pain status post fall.  LEFT HIP - COMPLETE 2+ VIEW  Comparison: None.  Findings: The bones are diffusely demineralized.  There  is advanced left hip arthropathy with superolateral migration and flattening of the femoral head.  There are multiple subchondral cysts.  There is lesser right hip joint space loss and subchondral cyst formation. The right femoral head demonstrates some subchondral sclerosis which could be degenerative or secondary to avascular necrosis. There is no evidence of acute fracture or dislocation.  IMPRESSION:  1.  Bilateral hip arthropathy, worse on the left where it is advanced. 2.  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  Although there is some flattening of the left femoral head, it does not show definite signs of underlying avascular necrosis. 3.  No evidence of acute fracture or  dislocation.   Original Report Authenticated By: Carey Bullocks, M.D.    Ct Head Wo Contrast  04/03/2013  *RADIOLOGY REPORT*  Clinical Data: Dementia.  Gait disturbance.  CT HEAD WITHOUT CONTRAST  Technique:  Contiguous axial images were obtained from the base of the skull through the vertex without contrast.  Comparison: None.  Findings: No skull fracture or intracranial hemorrhage.  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  Small vessel disease type changes.  No CT evidence of large acute infarct.  Mild global atrophy without hydrocephalus.  No intracranial mass lesion detected on this unenhanced exam.  Vascular calcifications.  Mastoid air cells, middle ear cavities and visualized paranasal sinuses are clear.  Orbital structures unremarkable.  IMPRESSION: No skull fracture or intracranial hemorrhage.  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  Small vessel disease type changes.  No CT evidence of large acute infarct.  Mild global atrophy without hydrocephalus.   Original Report Authenticated By: Lacy Duverney, M.D.    Dg Knee Complete 4 Views Left  04/03/2013  *RADIOLOGY REPORT*  Clinical Data: Knee pain status post fall.  LEFT KNEE - COMPLETE 4+ VIEW  Comparison: None.  Findings: The bones are demineralized.  There is minimal tricompartmental joint space loss for age with meniscal chondrocalcinosis medially.  There is no evidence of acute fracture, dislocation or significant knee joint effusion.  IMPRESSION: No acute osseous findings.   Original Report Authenticated By: Carey Bullocks, M.D.    Medications: I have reviewed the patient's current medications. Scheduled Meds: . amLODipine  10 mg Oral Daily  . aspirin EC  81 mg Oral Daily  . atorvastatin  40 mg Oral Daily  . cefTRIAXone (ROCEPHIN)  IV  1 g Intravenous Q24H  . clopidogrel  75 mg Oral Daily  . enoxaparin (LOVENOX) injection  40 mg Subcutaneous Q24H  . insulin aspart  0-9 Units Subcutaneous TID  WC  . irbesartan  300 mg Oral Daily  . pneumococcal 23 valent vaccine  0.5 mL Intramuscular Tomorrow-1000  . sodium chloride  3 mL Intravenous Q12H  . tamsulosin  0.4 mg Oral QPC supper   Continuous Infusions: . sodium chloride 125 mL/hr at 04/05/13 0336   PRN Meds:.albuterol Assessment/Plan:  UTI (lower urinary tract infection) - Repeat U/A shows signs of infection and continue rocpehin IV daily 1g.   -urine culture still pending -Fluids NSL today -See below for further details  -PT/OT for evaluation  Urinary incontinence - Sounds like functional. Bladder scan was normal making neurologic cause less likely. Leading cause poor mobility in getting to the bathroom. -Encourage toileting with assistance from nursing staff  -Continue tamsulosin  -Urine culture pending  Hypertension - Will keep on home meds norvasc and convert ARB to formulary equivalent. Will monitor closely.   History of stroke - Will keep on  ASA and plavix as recent stroke was his 2nd. No known history of A. Fib although sister only knew partial history. On statin and diabetic management. BPs are reasonably controlled and will monitor closely. Mental status change is resolved and back to normal.  -PT/OT consulted recommending SNF and family is agreeable with that decision.  Question of dementia - Unclear baseline as sister has not been around the patient for some time. Will monitor for signs of sundowning. Will try to minimize interventions and needle sticks to minimize risk of delirium while in-patient.   Hyperlipemia - Continue on statin.   Diabetes mellitus type 2 in nonobese - On metformin 500 BID, onglyza 5 mg PO daily, humulin 10 units TID with meals at home. Sugars in the high 160-200s here.  -HgA1c 7.7 and may benefit from tighter home control  -Sugar not elevated or hypoglycemic in ED  -SSI sensitive while in-patient   DVT ppx - lovenox [place] daily  Dispo: Disposition is deferred at this time, awaiting  improvement of current medical problems.  Anticipated discharge in approximately ~2 day(s).   The patient does have a current PCP (Default, Provider, MD), therefore is not requiring OPC follow-up after discharge.   The patient does not have transportation limitations that hinder transportation to clinic appointments.  .Services Needed at time of discharge: Y = Yes, Blank = No PT:   OT:   RN:   Equipment:   Other:     LOS: 2 days   Genella Mech 04/05/2013, 9:49 AM

## 2013-04-06 LAB — GLUCOSE, CAPILLARY
Glucose-Capillary: 165 mg/dL — ABNORMAL HIGH (ref 70–99)
Glucose-Capillary: 218 mg/dL — ABNORMAL HIGH (ref 70–99)

## 2013-04-06 NOTE — Progress Notes (Signed)
Subjective: The patient is sitting up in the chair this morning and had finished his entire breakfast. He is not having any complaints. No chest pain, SOB, cough. No fever or chills. No dysuria.   Objective: Vital signs in last 24 hours: Filed Vitals:   04/05/13 1349 04/05/13 2143 04/06/13 0524 04/06/13 0922  BP: 126/62 155/78 158/86 131/70  Pulse: 76 68 70   Temp: 98.2 F (36.8 C) 98.1 F (36.7 C) 98.4 F (36.9 C)   TempSrc: Oral Oral Oral   Resp: 18 18 18    Height:      Weight:      SpO2: 97% 99% 99%    Weight change:   Intake/Output Summary (Last 24 hours) at 04/06/13 1018 Last data filed at 04/06/13 1610  Gross per 24 hour  Intake    633 ml  Output    600 ml  Net     33 ml   Physical Exam:  General: resting in chair, just finished breakfast, more communicative, understands what we are saying, follows basic commands  HEENT: PERRL, EOMI, no scleral icterus  Cardiac: RRR, no rubs, murmurs or gallops  Pulm: clear to auscultation bilaterally, moving normal volumes of air  Abd: soft, nontender, nondistended, BS present  Ext: warm and well perfused, no pedal edema  Neuro: awake, moves all 4 extremities, globally strength seems decreased but equal, sensation intact globally. No obvious facial droop or focal deficit.  Lab Results: Basic Metabolic Panel:  Recent Labs Lab 04/03/13 1243 04/04/13 0544  NA 139 136  K 3.1* 3.5  CL 102 105  CO2 29 23  GLUCOSE 142* 177*  BUN 14 11  CREATININE 0.73 0.60  CALCIUM 9.2 8.5   Liver Function Tests:  Recent Labs Lab 04/03/13 1243 04/04/13 0544  AST 107* 72*  ALT 91* 70*  ALKPHOS 74 63  BILITOT 0.5 0.5  PROT 8.0 6.8  ALBUMIN 3.1* 2.5*  CBC:  Recent Labs Lab 04/03/13 1243 04/04/13 0544  WBC 6.4 5.9  NEUTROABS 3.3  --   HGB 14.4 13.3  HCT 39.8 37.4*  MCV 88.6 88.0  PLT 180 150   Cardiac Enzymes:  Recent Labs Lab 04/03/13 1243  CKTOTAL 136   CBG:  Recent Labs Lab 04/04/13 2154 04/05/13 0749  04/05/13 1138 04/05/13 1652 04/05/13 2138 04/06/13 0750  GLUCAP 214* 166* 214* 222* 144* 160*   Urinalysis:  Recent Labs Lab 04/03/13 2317  COLORURINE YELLOW  LABSPEC 1.019  PHURINE 6.5  GLUCOSEU NEGATIVE  HGBUR NEGATIVE  BILIRUBINUR NEGATIVE  KETONESUR NEGATIVE  PROTEINUR NEGATIVE  UROBILINOGEN 1.0  NITRITE NEGATIVE  LEUKOCYTESUR LARGE*   Micro Results: Recent Results (from the past 240 hour(s))  URINE CULTURE     Status: None   Collection Time    04/03/13 11:17 PM      Result Value Range Status   Specimen Description URINE, RANDOM   Final   Special Requests NONE   Final   Culture  Setup Time 04/04/2013 00:32   Final   [place] Count NO GROWTH   Final   Culture NO GROWTH   Final   Report Status 04/05/2013 FINAL   Final   Studies/Results: No results found. Medications: I have reviewed the patient's current medications. Scheduled Meds: . amLODipine  10 mg Oral Daily  . aspirin EC  81 mg Oral Daily  . atorvastatin  40 mg Oral Daily  . cefTRIAXone (ROCEPHIN)  IV  1 g Intravenous Q24H  . clopidogrel  75 mg Oral Daily  .  enoxaparin (LOVENOX) injection  40 mg Subcutaneous Q24H  . insulin aspart  0-9 Units Subcutaneous TID WC  . irbesartan  300 mg Oral Daily  . sodium chloride  3 mL Intravenous Q12H  . tamsulosin  0.4 mg Oral QPC supper   Continuous Infusions:   PRN Meds:.albuterol Assessment/Plan:  UTI (lower urinary tract infection) - Repeat U/A shows signs of infection and continue rocpehin IV daily 1g.  Day 4/5 of treatment. -urine culture no growth but drawn after IV antibiotics, will complete course with 5 days of therapy -See below for further details  -PT/OT for evaluation  Urinary incontinence - Sounds like functional. Bladder scan was normal making neurologic cause less likely. Leading cause poor mobility in getting to the bathroom. -Encourage toileting with assistance from nursing staff  -Continue tamsulosin  -Using condom cath  effectively  Hypertension - Will keep on home meds norvasc and convert ARB to formulary equivalent. Will monitor closely.   History of stroke - Will keep on ASA and plavix as recent stroke was his 2nd. No known history of A. Fib although sister only knew partial history. On statin and diabetic management. BPs are reasonably controlled and will monitor closely. Mental status change is resolved and back to normal.  -PT/OT consulted recommending SNF and family is agreeable with that decision.  Question of dementia - Unclear baseline as sister has not been around the patient for some time. Will monitor for signs of sundowning. Will try to minimize interventions and needle sticks to minimize risk of delirium while in-patient.   Hyperlipemia - Continue on statin.   Diabetes mellitus type 2 in nonobese - On metformin 500 BID, onglyza 5 mg PO daily, humulin 10 units TID with meals at home. Sugars in the high 160-200s here.  -HgA1c 7.7 and may benefit from tighter home control  -SSI sensitive while in-patient   DVT ppx - lovenox [place] daily  Dispo: Disposition is deferred at this time, awaiting improvement of current medical problems.  Anticipated discharge in approximately ~2 day(s).   The patient does have a current PCP (Default, Provider, MD), therefore is not requiring OPC follow-up after discharge.   The patient does not have transportation limitations that hinder transportation to clinic appointments.  .Services Needed at time of discharge: Y = Yes, Blank = No PT:   OT:   RN:   Equipment:   Other:     LOS: 3 days   Genella Mech 04/06/2013, 10:18 AM

## 2013-04-06 NOTE — H&P (Signed)
Internal Medicine Attending Admission Note Date: 04/04/2013  Patient name: Jerry Stanley Medical record number: [id number] Date of birth: 10-24-1941 Age: 72 y.o. Gender: male  I saw and evaluated the patient. I reviewed the resident's note and I agree with the resident's findings and plan as documented in the resident's note.  Chief Complaint(s): Urinary incontinence and altered mental status  History - key components related to admission: Patient is a 72 year old man with past medical history most significant for left sided stroke 2 months ago s/p rehab for 3 weeks and discharged home about 1 month ago, type 2 diabetes, hypertension, hyperlipidemia and benign prostatic hypertrophy who  Presented with 2 days of urinary incontinence. Patient is much more awake and alert as compared to yesterday when he was unable to give any history(as per notes).  It seems that urinary incontinence  Has been there for only 2 days. Patient denies any fevers, chills or abdominal pain. Patiient was found to have a urinary tract infection based on his UA findings in ER.  Patient has some residual weakness on his left side from his recent stroke but other wise lives by himself in a trailer about 40 miles from [place]. His PCP is in [facility] and he would like a PCP here in [place] as his sister lives here and he plans to stay around her.   Patient complains of pain in his hip and knee on left side but denies having any other complaints.  14 point review of systems is negative except as noted above.    Physical Exam - key components related to admission:  Filed Vitals:   04/03/13 1840 04/03/13 1938 04/04/13 0539 04/04/13 0625  BP:  144/80 163/90   Pulse:  71 76 73  Temp:  98.7 F (37.1 C) 99.1 F (37.3 C)   TempSrc:  Oral Oral   Resp: 16 16 16    Height:  5\' 7"  (1.702 m)    Weight:  154 lb 15.7 oz (70.3 kg)    SpO2:  97%  92%  Physical Exam: General: Vital signs reviewed and noted. Well-developed,  well-nourished, in no acute distress; alert, appropriate and cooperative throughout examination.  Head: Normocephalic, atraumatic.  Eyes: PERRL, EOMI, No signs of anemia or jaundince.  Nose: Mucous membranes moist, not inflammed, nonerythematous.  Throat: Oropharynx nonerythematous, no exudate appreciated.   Neck: No deformities, masses, or tenderness noted.Supple, No carotid Bruits, no JVD.  Lungs:  Normal respiratory effort. Clear to auscultation BL without crackles or wheezes.  Heart: RRR. S1 and S2 normal without gallop, murmur, or rubs.  Abdomen:  BS normoactive. Soft, Nondistended, non-tender.  No masses or organomegaly.  Extremities: No pretibial edema.  Neurologic: A&O X3, CN II - XII are grossly intact. Motor strength is 5/5 in the right upper and lower extremities and 4/5 on his left upper and lower extremities, Sensations intact to light touch, Cerebellar signs negative.  Skin: No visible rashes, scars.    Lab results:   Basic Metabolic Panel:  Recent Labs  95/28/41 1243 04/04/13 0544  NA 139 136  K 3.1* 3.5  CL 102 105  CO2 29 23  GLUCOSE 142* 177*  BUN 14 11  CREATININE 0.73 0.60  CALCIUM 9.2 8.5   Liver Function Tests:  Recent Labs  04/03/13 1243 04/04/13 0544  AST 107* 72*  ALT 91* 70*  ALKPHOS 74 63  BILITOT 0.5 0.5  PROT 8.0 6.8  ALBUMIN 3.1* 2.5*   CBC:  Recent Labs  04/03/13 1243 04/04/13 0544  WBC 6.4 5.9  NEUTROABS 3.3  --   HGB 14.4 13.3  HCT 39.8 37.4*  MCV 88.6 88.0  PLT 180 150   Cardiac Enzymes:  Recent Labs  04/03/13 1243  CKTOTAL 136   CBG:  Recent Labs  04/03/13 2126 04/04/13 0729  GLUCAP 224* 172*   Imaging results:  Dg Chest 1 View  04/03/2013  *RADIOLOGY REPORT*  Clinical Data: Fall.  Left knee pain.  CHEST - 1 VIEW  Comparison: None.  Findings: Lungs are clear.  No pneumothorax or pleural fluid. Heart size is upper normal. No focal bony abnormality.  IMPRESSION: No acute disease.   Original Report  Authenticated By: Holley Dexter, M.D.    Dg Hip Complete Left  04/03/2013  *RADIOLOGY REPORT*  Clinical Data: Left hip pain status post fall.  LEFT HIP - COMPLETE 2+ VIEW  Comparison: None.  Findings: The bones are diffusely demineralized.  There is advanced left hip arthropathy with superolateral migration and flattening of the femoral head.  There are multiple subchondral cysts.  There is lesser right hip joint space loss and subchondral cyst formation. The right femoral head demonstrates some subchondral sclerosis which could be degenerative or secondary to avascular necrosis. There is no evidence of acute fracture or dislocation.  IMPRESSION:  1.  Bilateral hip arthropathy, worse on the left where it is advanced. 2.  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  Although there is some flattening of the left femoral head, it does not show definite signs of underlying avascular necrosis. 3.  No evidence of acute fracture or dislocation.   Original Report Authenticated By: Carey Bullocks, M.D.    Ct Head Wo Contrast  04/03/2013  *RADIOLOGY REPORT*  Clinical Data: Dementia.  Gait disturbance.  CT HEAD WITHOUT CONTRAST  Technique:  Contiguous axial images were obtained from the base of the skull through the vertex without contrast.  Comparison: None.  Findings: No skull fracture or intracranial hemorrhage.  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  Small vessel disease type changes.  No CT evidence of large acute infarct.  Mild global atrophy without hydrocephalus.  No intracranial mass lesion detected on this unenhanced exam.  Vascular calcifications.  Mastoid air cells, middle ear cavities and visualized paranasal sinuses are clear.  Orbital structures unremarkable.  IMPRESSION: No skull fracture or intracranial hemorrhage.  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  Small vessel disease type changes.  No CT evidence of large acute infarct.   Mild global atrophy without hydrocephalus.   Original Report Authenticated By: Lacy Duverney, M.D.    Dg Knee Complete 4 Views Left  04/03/2013  *RADIOLOGY REPORT*  Clinical Data: Knee pain status post fall.  LEFT KNEE - COMPLETE 4+ VIEW  Comparison: None.  Findings: The bones are demineralized.  There is minimal tricompartmental joint space loss for age with meniscal chondrocalcinosis medially.  There is no evidence of acute fracture, dislocation or significant knee joint effusion.  IMPRESSION: No acute osseous findings.   Original Report Authenticated By: Carey Bullocks, M.D.     Other results: EKG: 76 beats per minute, normal axis, left ventricular hypertrophy  Assessment & Plan by Problem:  Principal Problem:   UTI (lower urinary tract infection) Active Problems:   Urinary incontinence   Hypertension   History of stroke   Hyperlipemia   Diabetes mellitus type 2 in nonobese  Patient is a 72 year old man with past medical history most significant for recent stroke who was discharged from  a rehabilitation facility but has not even been able to get medical care. It seems that patient was doing well up until just 2 days ago when he started having symptoms of urinary incontinence which seem most likely from UTI.  I believe that patient needs to get plugged in with one of the physicians in the community here in [place] (possibly [place]). We will treat his urinary tract infection with Rocephin. We will also obtain bladder scan to see if he is having any residuals. We will also get his HbA1c checked to assess recent control. Will get physical therapy involved and have them comment on appropriate disposition.  Altered mental state is completely resolved now.   Patient will need a new primary care physician once he is discharged from the hospital.  Rest of the medical management as per resident's note.  Lars Mage MD Faculty-Internal Medicine Residency Program

## 2013-04-06 NOTE — Progress Notes (Signed)
Internal Medicine Teaching Service Attending Note Date: 04/06/2013  Patient name: Jerry Stanley  Medical record number: [id number]  Date of birth: Apr 21, 1941    This patient has been seen and discussed with the house staff. Please see their note for complete details. I concur with their findings with the following additions/corrections: Patient continues to have urinary incontinence and has a condom cath. Day 5 of antibiotics. Awaiting SNF. Will need OPC follow up. Also we should d/c condom cath and encourage him to ambulate to bathroom with assistance today. If he is still incontinent, may consider anticholinergic oxybutynin for retension.   Lars Mage 04/06/2013, 11:04 AM

## 2013-04-07 LAB — GLUCOSE, CAPILLARY: Glucose-Capillary: 176 mg/dL — ABNORMAL HIGH (ref 70–99)

## 2013-04-07 MED ORDER — LEVOFLOXACIN 250 MG PO TABS
250.0000 mg | ORAL_TABLET | Freq: Every day | ORAL | Status: AC
  Administered 2013-04-07: 250 mg via ORAL
  Filled 2013-04-07: qty 1

## 2013-04-07 NOTE — Progress Notes (Signed)
Medical Student Daily Progress Note  Subjective: Patient pulled out IV yesterday, IV access not replaced. Rocephin IV was replaced with Levaquin po. Condom cath was removed yesterday. Patient reports that he is not incontinent of urine by asking the nurse to use the bathroom. Patient denies dysuria and burning on urination.   Patient has been eating and sleeping well. No acute events overnight. Patient denies chest pain, shortness of breath, abdominal pain, nausea, vomiting, and diarrhea.   Objective: Vital signs in last 24 hours: Filed Vitals:   04/06/13 0922 04/06/13 1442 04/06/13 2201 04/07/13 0531  BP: 131/70 133/73 149/75 129/78  Pulse:  73 64 69  Temp:  98.2 F (36.8 C) 98.2 F (36.8 C) 99.1 F (37.3 C)  TempSrc:  Oral Oral Oral  Resp:  18 18 17   Height:      Weight:      SpO2:  98% 98% 98%   Weight change:   Intake/Output Summary (Last 24 hours) at 04/07/13 0850 Last data filed at 04/07/13 0324  Gross per 24 hour  Intake    443 ml  Output    400 ml  Net     43 ml   Physical Exam: General: alert, cooperative and talking, in no apparent distress Lungs: clear to ascultation bilaterally, normal work of respiration, no wheezes, rales or rhonchi Heart: regular rate and rhythm, 2/6 systolic murmur heard most prominently at apex Abdomen: soft, non-tender, non-distended, normal bowel sounds Extremities: 2+ DP pulses bilaterally, no cyanosis, clubbing, or edema Skin: dry, flaky skin on lower extremities, onychomycosis on fingers and toes Neurologic: alert & oriented X3, cranial nerves grossly II-XII intact, strength grossly intact, sensation intact to light touch  Lab Results: Basic Metabolic Panel:  Recent Labs Lab 04/03/13 1243 04/04/13 0544  NA 139 136  K 3.1* 3.5  CL 102 105  CO2 29 23  GLUCOSE 142* 177*  BUN 14 11  CREATININE 0.73 0.60  CALCIUM 9.2 8.5   Liver Function Tests:  Recent Labs Lab 04/03/13 1243 04/04/13 0544  AST 107* 72*  ALT 91* 70*   ALKPHOS 74 63  BILITOT 0.5 0.5  PROT 8.0 6.8  ALBUMIN 3.1* 2.5*   No results found for this basename: LIPASE, AMYLASE,  in the last 168 hours No results found for this basename: AMMONIA,  in the last 168 hours CBC:  Recent Labs Lab 04/03/13 1243 04/04/13 0544  WBC 6.4 5.9  NEUTROABS 3.3  --   HGB 14.4 13.3  HCT 39.8 37.4*  MCV 88.6 88.0  PLT 180 150   Cardiac Enzymes:  Recent Labs Lab 04/03/13 1243  CKTOTAL 136   BNP: No results found for this basename: PROBNP,  in the last 168 hours D-Dimer: No results found for this basename: DDIMER,  in the last 168 hours CBG:  Recent Labs Lab 04/05/13 2138 04/06/13 0750 04/06/13 1148 04/06/13 1651 04/06/13 2159 04/07/13 0741  GLUCAP 144* 160* 165* 218* 196* 158*   Hemoglobin A1C:  Recent Labs Lab 04/04/13 0544  HGBA1C 7.7*   Urinalysis:  Recent Labs Lab 04/03/13 2317  COLORURINE YELLOW  LABSPEC 1.019  PHURINE 6.5  GLUCOSEU NEGATIVE  HGBUR NEGATIVE  BILIRUBINUR NEGATIVE  KETONESUR NEGATIVE  PROTEINUR NEGATIVE  UROBILINOGEN 1.0  NITRITE NEGATIVE  LEUKOCYTESUR LARGE*   Micro Results: Recent Results (from the past 240 hour(s))  URINE CULTURE     Status: None   Collection Time    04/03/13 11:17 PM      Result Value Range Status  Specimen Description URINE, RANDOM   Final   Special Requests NONE   Final   Culture  Setup Time 04/04/2013 00:32   Final   Colony Count NO GROWTH   Final   Culture NO GROWTH   Final   Report Status 04/05/2013 FINAL   Final   Studies/Results: No results found.  Medications: I have reviewed the patient's current medications. Scheduled Meds: . amLODipine  10 mg Oral Daily  . aspirin EC  81 mg Oral Daily  . atorvastatin  40 mg Oral Daily  . clopidogrel  75 mg Oral Daily  . enoxaparin (LOVENOX) injection  40 mg Subcutaneous Q24H  . insulin aspart  0-9 Units Subcutaneous TID WC  . irbesartan  300 mg Oral Daily  . levofloxacin  250 mg Oral Daily  . sodium chloride  3 mL  Intravenous Q12H  . tamsulosin  0.4 mg Oral QPC supper   Continuous Infusions:  PRN Meds:.albuterol  Assessment/Plan: Patient is a 72 year old with past medical history significant for stroke in March 2014, diabetes mellitus, HTN, and hypercholesterolemia, who presents with urinary incontinence.   1. Possible UTI: Patient has urinary incontinence of unknown duration. Urinalysis was performed in ED, consistent with UTI (cloudy, large leuks). Urine and blood cultures negative, but cultures taken after abx started in ED. Patient has been afebrile, white count within normal limits during this hospitalization. Received Rocephin x3 until patient removed IV, finishing 5 days antibiotic treatment with Levaquin po. -Levaquin 250mg  po, day 5/5  2. Urinary incontinence, resolved: Unknown when the patient's urinary incontinence started, whether after stroke or more recently. Bladder scan shows no residual urine, rules out overflow incontinence. Possible contributory factors of urinary incontinence are infection/UTI or BPH. Given that patient is also incontinent of stool (per sister's report), not reported while in hospital.  -Condom cath removed on 4/20  3. Possible altered mental status, resolved: Patient now alert and oriented X3. It is uncertain whether the patient's change in mental status is acute or chronic. Potential etiologies include acute stroke or hemorrhage, prior stroke, dementia, UTI, or normal pressure hydrocephalus. CT head does not indicate intracranial hemorrhage and does not suggest stroke, although TIAs cannot be ruled out. CT head shows mild global atrophy that could be consistent with dementia, but time course of mental status changes needs to be elucidated. NPH is possible with incontinence, possible altered mental status, but patient does not have wobbly gait. CT head is not consistent with NPH.   4. Hypertension, uncontrolled: Patient's BP normal to borderline high. Patient has likely  been off of BP medications of Diovan and amlodipine.  -Continue Diovan and amlodipine  -Assess HTN as outpatient  5. History of stroke: Patient has history of two strokes, most recent in March 2014. His stroke risk factors include diabetes, HTN, and prior stroke. Restarted home aspirin and Plavix for prevention of future strokes.  -ASA 81mg  daily  -Plavix 75mg  daily  -Stroke risk factor work-up as outpatient  7. Diabetes mellitus type 2: Patient's diabetes status is unknown. He presumably has been off medications since March. Has has home medications of Humalog (10u TID with meals), metformin 500mg  bid, and Onglyza 5mg  daily. BGs have been controlled with small amounts of SSI (~3 units/meal). HgbA1C = 7.7. -Continue sliding scale insulin  -Resume metformin and onglyza at discharge (d/c Humalog) -Follow-up with PCP for DM monitoring  6. Hyperlipidemia: Patient will need assessment (lipid panel) as outpatient.  -Restart home statin   7. DVT ppx: Lovenox  8. F/E/N:  -No IV access -Normal diet   9. Dispo: Disposition is deferred at this time, awaiting SNF placement. Anticipated discharge today or tomorrow.    The patient does not have a current PCP, therefore will be requiring OCP follow-up after discharge.   The patient does not have transportation limitations that hinder transportation to clinic appointments.    LOS: 4 days   This is a Psychologist, occupational Note.  The care of the patient was discussed with Dr. Dorise Hiss and the assessment and plan formulated with their assistance.  Please see their attached note for official documentation of the daily encounter.  Cheryl Flash 04/07/2013, 8:50 AM

## 2013-04-07 NOTE — Clinical Social Work Placement (Addendum)
    Clinical Social Work Department CLINICAL SOCIAL WORK PLACEMENT NOTE 04/07/2013  Patient:  [name], [name]  Account Number:  [id number] Admit date:  04/03/2013  Clinical Social Worker:  Hulan Fray  Date/time:  04/07/2013 10:58 AM  Clinical Social Work is seeking post-discharge placement for this patient at the following level of care:   SKILLED NURSING   (*CSW will update this form in Epic as items are completed)   04/07/2013  Patient/family provided with [facility] Department of Clinical Social Work's list of facilities offering this level of care within the geographic area requested by the patient (or if unable, by the patient's family).  04/07/2013  Patient/family informed of their freedom to choose among providers that offer the needed level of care, that participate in Medicare, Medicaid or managed care program needed by the patient, have an available bed and are willing to accept the patient.  04/07/2013  Patient/family informed of [facility]' ownership interest in [facility], as well as of the fact that they are under no obligation to receive care at this facility.  PASARR submitted to EDS on 04/07/2013 PASARR number received from EDS on 04/07/2013  FL2 transmitted to all facilities in geographic area requested by pt/family on  04/07/2013 FL2 transmitted to all facilities within larger geographic area on   Patient informed that his/her managed care company has contracts with or will negotiate with  certain facilities, including the following:     Patient/family informed of bed offers received:  04-07-13 Patient chooses bed at [facility] Physician recommends and patient chooses bed at    Patient to be transferred to [facility] on 04-08-13   Patient to be transferred to facility by [facility]  The following physician request were entered in Epic:   Additional Comments:

## 2013-04-07 NOTE — Progress Notes (Signed)
Resident Co-sign Daily Note: I have seen the patient and reviewed the daily progress note by Cheryl Flash MS 4 and discussed the care of the patient with them.  See below for documentation of my findings, assessment, and plans.  Subjective: The patient is doing well today and is sitting in bed. He denies dysuria and is doing well with urinating. He needs assistance to get to the bathroom. He is not having chest pain, SOB, abdominal pain. He denies nausea/vomiting/diarrhea.   Objective: Vital signs in last 24 hours: Filed Vitals:   04/06/13 2201 04/07/13 0531 04/07/13 0934 04/07/13 1405  BP: 149/75 129/78 131/78 132/80  Pulse: 64 69  68  Temp: 98.2 F (36.8 C) 99.1 F (37.3 C)  99.1 F (37.3 C)  TempSrc: Oral Oral  Oral  Resp: 18 17  18   Height:      Weight:      SpO2: 98% 98%  99%   Physical Exam: General: resting in chair, just finished breakfast, more communicative, understands what we are saying, follows basic commands  HEENT: PERRL, EOMI, no scleral icterus  Cardiac: RRR, systolic murmur  Pulm: clear to auscultation bilaterally, moving normal volumes of air  Abd: soft, nontender, nondistended, BS present  Ext: warm and well perfused, no pedal edema  Neuro: awake, moves all 4 extremities, globally strength seems decreased but equal, sensation intact globally. No obvious facial droop or focal deficit.   Lab Results: Reviewed and documented in Electronic Record Micro Results: Reviewed and documented in Electronic Record Studies/Results: Reviewed and documented in Electronic Record Medications: I have reviewed the patient's current medications. Scheduled Meds: . amLODipine  10 mg Oral Daily  . aspirin EC  81 mg Oral Daily  . atorvastatin  40 mg Oral Daily  . clopidogrel  75 mg Oral Daily  . enoxaparin (LOVENOX) injection  40 mg Subcutaneous Q24H  . insulin aspart  0-9 Units Subcutaneous TID WC  . irbesartan  300 mg Oral Daily  . sodium chloride  3 mL Intravenous Q12H  .  tamsulosin  0.4 mg Oral QPC supper   Continuous Infusions:  PRN Meds:.albuterol Assessment/Plan: UTI (lower urinary tract infection) - Repeat U/A shows signs of infection and continue rocpehin IV daily 1g. Day 4/5 of treatment.  -urine culture original grew providencia and did receive 4 days of adequate therapy and phone consult with ID we feel safe monitoring him closely for recurrence with no further antibiotic treatment -See below for further details  -PT/OT for evaluation   Urinary incontinence - Sounds like functional. Continued home flomax and voiding with help here with no problems. -Encourage toileting with assistance from nursing staff  -Continue tamsulosin    Hypertension - Will keep on home meds norvasc and convert ARB to formulary equivalent. Will monitor closely.   History of stroke - Will keep on ASA and plavix as recent stroke was his 2nd. No known history of A. Fib although sister only knew partial history. On statin and diabetic management. BPs are reasonably controlled and will monitor closely. Mental status change is resolved and back to normal.  -PT/OT consulted recommending SNF and family is agreeable with that decision -Are trying to obtain records from previous stay for stroke  Question of dementia - Unclear baseline as sister has not been around the patient for some time. Will monitor for signs of sundowning. Will try to minimize interventions and needle sticks to minimize risk of delirium while in-patient.   Hyperlipemia - Continue on statin.   Diabetes  mellitus type 2 in nonobese - On metformin 500 BID, onglyza 5 mg PO daily, humulin 10 units TID with meals at home. Sugars in the high 160-200s here.  -HgA1c 7.7 and may benefit from tighter home control  -SSI sensitive while in-patient   DVT ppx - lovenox [place] daily    LOS: 4 days   Genella Mech 04/07/2013, 4:26 PM

## 2013-04-07 NOTE — Discharge Summary (Signed)
Internal Medicine Teaching [facility] Discharge Note  Name: Jerry Stanley MRN: [id number] DOB: 11/24/1941 72 y.o.  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     amLODipine 10 MG tablet  Commonly known as:  NORVASC  Take 10 mg by mouth daily.     aspirin 81 MG EC tablet  Take 1 tablet (81 mg total) by mouth daily.     atorvastatin 40 MG tablet  Commonly known as:  LIPITOR  Take 40 mg by mouth daily.     clopidogrel 75 MG tablet  Commonly known as:  PLAVIX  Take 75 mg by mouth daily.     metFORMIN 500 MG tablet  Commonly known as:  GLUCOPHAGE  Take 500 mg by mouth 2 (two) times daily with a meal.     saxagliptin HCl 2.5 MG Tabs tablet  Commonly known as:  ONGLYZA  Take 2.5 mg by mouth daily.     tamsulosin 0.4 MG Caps  Commonly known as:  FLOMAX  Take by mouth.     valsartan 160 MG tablet  Commonly known as:  DIOVAN  Take 160 mg by mouth daily.        Disposition and follow-up:   Mr.Jerry Stanley was discharged from [facility] in Stable condition to Skilled Nursing Facility.  Please monitor  urinary incontinence and functional status    Discharge Orders   Future Orders  Complete By Expires     Diet Carb Modified  As directed     Increase activity slowly  As directed      Consultations:  none  Procedures Performed:  Dg Chest 1 View  04/03/2013  *RADIOLOGY REPORT*  Clinical Data: Fall.  Left knee pain.  CHEST - 1 VIEW  Comparison: None.  Findings: Lungs are clear.  No pneumothorax or pleural fluid. Heart size is upper normal. No focal bony abnormality.  IMPRESSION: No acute disease.   Original Report Authenticated By: Holley Dexter, M.D.    Dg Hip Complete Left  04/03/2013  *RADIOLOGY REPORT*  Clinical Data: Left hip pain status post fall.  LEFT HIP - COMPLETE 2+ VIEW  Comparison: None.  Findings: The bones are diffusely demineralized.  There is advanced left hip arthropathy with superolateral migration and flattening of the femoral head.  There are multiple subchondral cysts.  There is lesser right hip joint space loss and subchondral cyst formation. The right femoral head demonstrates some subchondral sclerosis which could be degenerative or secondary to avascular necrosis. There is no evidence of acute fracture or dislocation.  IMPRESSION:  1.  Bilateral hip arthropathy, worse on the left where it is advanced. 2.  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  Although there is some flattening of the left femoral head, it does not show definite signs of underlying avascular necrosis. 3.  No evidence of acute fracture or dislocation.   Original Report Authenticated By: Carey Bullocks, M.D.    Ct Head Wo Contrast  04/03/2013  *RADIOLOGY REPORT*  Clinical Data: Dementia.  Gait disturbance.  CT HEAD WITHOUT CONTRAST  Technique:  Contiguous axial images were obtained from the base of the skull through the vertex without contrast.  Comparison: None.  Findings: No skull fracture or intracranial hemorrhage.  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  Small vessel disease type changes.  No CT evidence of large acute infarct.  Mild  global atrophy without hydrocephalus.  No intracranial mass lesion detected on this unenhanced exam.  Vascular calcifications.  Mastoid air cells, middle ear cavities and visualized paranasal sinuses are clear.  Orbital structures unremarkable.  IMPRESSION: No skull fracture or intracranial hemorrhage.  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  Small vessel disease type changes.  No CT evidence of large acute infarct.  Mild global atrophy without hydrocephalus.   Original Report Authenticated By: Lacy Duverney, M.D.    Dg Knee Complete 4 Views Left  04/03/2013  *RADIOLOGY REPORT*  Clinical Data: Knee pain status post fall.  LEFT KNEE - COMPLETE 4+ VIEW  Comparison: None.  Findings: The bones are demineralized.  There is minimal tricompartmental joint space loss for age with meniscal chondrocalcinosis medially.  There is no evidence of acute fracture, dislocation or significant knee joint effusion.  IMPRESSION: No acute osseous findings.   Original Report Authenticated By: Carey Bullocks, M.D.    Admission HPI:  The patient is a 72 YO man with PMH of stroke, DM II, HTN, HPL who is unable to speak much and is accompanied by his sister and her husband. He has history of stroke out of state (in [place]) with rehab in [facility]. He does normally live in [place] [facility] with his girlfriend. Per the sister, when he had the stroke he was unable to walk much afterwards and she doesn't know more that than. Supposedly after leaving the rehab place he was take back to the girlfriend's trailer. At some point social worker did not feel it was a safe environment and he was moved to the girlfriend's child's trailer which was also not a great environment. He supposedly had no follow up or medications after leaving rehab. The sister went to visit him a couple of weeks ago and the patient was brought to the sister's residence on Wednesday. The reason for bringing him to the ED today is for urinary incontinence which is new to  the sister however it is unclear how new it is to the patient. Supposedly the patient has been incontinence with stools and bladder since the stroke. It is unclear if he is unable to go or if he is simply unable to get to the bathroom and is having accidents on himself. Per the sister he is dribbling all the time. He is not having fevers or chills. The only complaint he has had was left pain in his left hip and knee. The sister mentions that there has been some mention of dementia recently. Per the sister he does not smoke, drink, or do drugs.   Hospital Course by problem list:  # UTI (lower urinary tract infection) - The patient had loss of original urine sample and so culture originated after 1st antibiotic dose and did not result in  growth although additional culture did grow providencia which was susceptible to Ceftriaxone and he did receive total 4 day course. He did have signs on infection on his urinalysis originally. He did improve with some ceftriaxone for total of 4 day course. His mental status did clear and he regained quite an appetite. Discussed with ID who recommended that 4 days of antibiotic therpy was sufficient.  He was found to be fairly dependent for most ADLs and therefore was discharged to SNF.  # Urinary incontinence - The patient did have condom catheter and was trialed for continence after infection resolved with mixed results. He was trialed on flomax and did well with urinal and assistance in using the toilet with result of continence.  # Hypertension - His blood pressures were well controlled on his home medications including Norvasc 10 mg  and Diovan 160 mg   # History of stroke - Patient was on ASA 325 mg and plavix 75 mg . We decreased Aspirin to 81 mg and continued with Plavix on day of discharge.  Continued Lipitor 40 mg  # Hyperlipemia - Patient was kept on statin during this hospital stay.  # Diabetes mellitus type 2 in nonobese - Patient's HgA1c was 7.7 and may  reflect the period of time he went without any of his medications at home. At home he takes metformin, onglyza, humulin 10 units with meals. In the hospital he was requiring only about 2-3 units with meals and may be wise to trial him on his home oral medications and decide about insulins after he can have his sugars monitored on oral medications. He did receive his pneumonia shot in the hospital. May need further management as an out-patient.  Discharge Vitals:  BP 149/82  Pulse 66  Temp(Src) 98.6 F (37 C) (Oral)  Resp 18  Ht 5\' 7"  (1.702 m)  Wt 154 lb 15.7 oz (70.3 kg)  BMI 24.27 kg/m2  SpO2 99%  Discharge Labs:  Results for orders placed during the hospital encounter of 04/03/13 (from the past 24 hour(s))  GLUCOSE, CAPILLARY     Status: Abnormal   Collection Time    04/07/13 12:07 PM      Result Value Range   Glucose-Capillary 231 (*) 70 - 99 mg/dL  GLUCOSE, CAPILLARY     Status: Abnormal   Collection Time    04/07/13  5:16 PM      Result Value Range   Glucose-Capillary 180 (*) 70 - 99 mg/dL  GLUCOSE, CAPILLARY     Status: Abnormal   Collection Time    04/07/13 10:07 PM      Result Value Range   Glucose-Capillary 176 (*) 70 - 99 mg/dL   Comment 1 Documented in Chart     Comment 2 Notify RN    GLUCOSE, CAPILLARY     Status: Abnormal   Collection Time    04/08/13  8:06 AM      Result Value Range   Glucose-Capillary 168 (*) 70 - 99 mg/dL   Comment 1 Documented in Chart     Comment 2 Notify RN      Signed: Almyra Deforest 04/08/2013, 11:02 AM   Time Spent on Discharge: 40 minutes Services Ordered on Discharge: none Equipment Ordered on Discharge: none

## 2013-04-07 NOTE — Clinical Social Work Note (Signed)
Clinical Social Worker was consulted for SNF placement for patient. CSW introduced self and explained reason for visit. Patient had sister, Delaine at bedside. Per sister, they are interested in a [address], excluding [facility] and both [facility] facilities and any locations that are not in [place], [place]. Per sister, she is in the process of gathering the necessary information of patient's birth certificate, social security card, etc to apply for Medicaid. Sister reported that they are looking for long term placement for patient. CSW explained the benefit of obtaining Medicaid for long term placement and provided Assisted Living facility list. Sister was agreeable for CSW to look for short term SNF placement initially and they pending patient's progress, he may be more appropriate for ALF after SNF. CSW inquired about a DSS worker following patient and per sister, the previous DSS worker in [place]. Signed off and referred to [employer]. DSS, but sister reported that she has not heard from anybody in [place].  CSW will complete FL2 for MD's signature and initiate SNF search in [place] Mohs Zyair Rhein MSW, [employer] [phone]

## 2013-04-07 NOTE — Progress Notes (Signed)
Physical Therapy Treatment Patient Details Name: Jerry Stanley MRN: [id number] DOB: Jan 28, 1941 Today's Date: 04/07/2013 Time: 4401-0272 PT Time Calculation (min): 36 min  PT Assessment / Plan / Recommendation Comments on Treatment Session  Pt improved in gait distance but continues to need follow up PT at SNF for increased strength and functional independence to decrease burden of care    Follow Up Recommendations  SNF     Does the patient have the potential to tolerate intense rehabilitation     Barriers to Discharge        Equipment Recommendations  None recommended by PT    Recommendations for Other Services    Frequency Min 3X/week   Plan      Precautions / Restrictions Precautions Precautions: Fall Restrictions Weight Bearing Restrictions: No   Pertinent Vitals/Pain No c/o pain    Mobility  Bed Mobility Bed Mobility: Rolling Right;Rolling Left Rolling Right: 4: Min assist Rolling Left: 4: Min assist Supine to Sit: 3: Mod assist;HOB flat Details for Bed Mobility Assistance: pt needs assist to bring left arm across body to assist with rolling Transfers Transfers: Sit to Stand;Stand to Sit Sit to Stand: 4: Min assist;With upper extremity assist;With armrests;From chair/3-in-1 Stand to Sit: 4: Min assist;With upper extremity assist;With armrests;To chair/3-in-1 Details for Transfer Assistance: extra time needed for all transitional movements Pt o bathroom for commode transfers Ambulation/Gait Ambulation/Gait Assistance: 4: Min assist Ambulation Distance (Feet): 200 Feet Assistive device: Rolling walker Ambulation/Gait Assistance Details: assist to apple socks and shoes.  Pt needed occasional assist for balance , especially on turns Frequent cues to keep head and chest up. Gait Pattern: Step-to pattern;Decreased stance time - left;Decreased step length - right;Trunk flexed Gait velocity: Slow gait speed General Gait Details: Gait slow  but steady.  Pt with good  distance, but has difficulty implementing cues to improve gait pattern. Stairs: No Wheelchair Mobility Wheelchair Mobility: No    Exercises     PT Diagnosis:    PT Problem List:   PT Treatment Interventions:     PT Goals Acute Rehab PT Goals PT Goal Formulation: With patient Time For Goal Achievement: 04/18/13 Potential to Achieve Goals: Good Pt will go Supine/Side to Sit: with supervision;with HOB 0 degrees PT Goal: Supine/Side to Sit - Progress: Progressing toward goal Pt will go Sit to Supine/Side: with supervision;with HOB 0 degrees PT Goal: Sit to Supine/Side - Progress: Progressing toward goal Pt will go Sit to Stand: with supervision;with upper extremity assist PT Goal: Sit to Stand - Progress: Progressing toward goal Pt will go Stand to Sit: with supervision;with upper extremity assist PT Goal: Stand to Sit - Progress: Progressing toward goal Pt will Ambulate: 51 - 150 feet;with supervision;with rolling walker PT Goal: Ambulate - Progress: Progressing toward goal  Visit Information  Last PT Received On: 04/07/13    Subjective Data  Subjective: I need to go to the  bathroom Patient Stated Goal: to get to the bathroom   Cognition  Cognition Arousal/Alertness: Awake/alert Behavior During Therapy: Flat affect    Balance  Balance Balance Assessed: Yes Static Sitting Balance Static Sitting - Balance Support: No upper extremity supported;Feet supported Static Sitting - Level of Assistance: 5: Stand by assistance Static Standing Balance Static Standing - Balance Support: Bilateral upper extremity supported Static Standing - Level of Assistance: 5: Stand by assistance Static Standing - Comment/# of Minutes: worked on trunk extension in standing  End of Session PT - End of Session Equipment Utilized During Treatment: Gait belt Activity  Tolerance: Patient limited by fatigue Patient left: in chair;with call bell/phone within reach Nurse Communication: Mobility status    GP    Bayard Hugger. [place], [place] [phone] 04/07/2013, 11:10 AM

## 2013-04-07 NOTE — Progress Notes (Signed)
Internal Medicine Teaching Service Attending Note Date: 04/07/2013  Patient name: Jerry Stanley  Medical record number: [id number]  Date of birth: 09-20-1941    This patient has been seen and discussed with the house staff. Please see their note for complete details. I concur with their findings with the following additions/corrections: Mr Soter was relaxing in bed. His sister and BIL were at bedside. He has no complaints. No urinary incontinence recently. Sister and pt agree much improved. On exam, systolic murmur about 3/6 otherwise normal. Labs show increased AST / ALT. Urine cx + Providencia rettgeri (50% ESBL per internet search). No blood cx were obtained. Will need to F/U sensitivity as resistance can be an issue. Otherwise, can be moved to SNF.    BUTCHER,ELIZABETH 04/07/2013, 11:35 AM

## 2013-04-07 NOTE — Clinical Social Work Note (Signed)
Clinical Social Worker followed up with patient's sister regarding the responses from the SNF search and sister chose [facility]. CSW left a message with Bjorn Loser, admissions coordinator regarding confirmation of facility. CSW will facilitate discharge when medically stable.   Rozetta Nunnery MSW, [employer] [phone]

## 2013-04-08 ENCOUNTER — Encounter (HOSPITAL_COMMUNITY): Payer: Self-pay | Admitting: Internal Medicine

## 2013-04-08 LAB — GLUCOSE, CAPILLARY

## 2013-04-08 MED ORDER — ASPIRIN 81 MG PO TBEC: 81.0000 mg | DELAYED_RELEASE_TABLET | Freq: Every day | ORAL | Status: DC

## 2013-04-08 NOTE — Clinical Social Work Note (Signed)
Clinical Social Worker facilitated discharge by contacting family and facility, [facility] regarding discharge today. CSW will complete discharge packet and will be placed with shadow chart. Patient will be transported via ambulance. CSW will sign off, as social work intervention is no longer needed.   Rozetta Nunnery MSW, [employer] [phone]

## 2013-04-08 NOTE — Plan of Care (Signed)
Problem: Phase III Progression Outcomes Goal: Activity at appropriate level-compared to baseline (UP IN CHAIR FOR HEMODIALYSIS)  Outcome: Completed/Met Date Met:  04/08/13 Going to SNF for rehab Goal: Discharge plan remains appropriate-arrangements made Outcome: Completed/Met Date Met:  04/08/13 Family accepted bed at [facility]

## 2013-04-08 NOTE — Progress Notes (Signed)
Jerry Stanley discharged Skilled nursing facility per MD order.  Report called to receiving Efraim Kaufmann, LPN at [facility].     Medication List    STOP taking these medications       insulin lispro 100 UNIT/ML injection  Commonly known as:  HUMALOG      TAKE these medications       albuterol 108 (90 BASE) MCG/ACT inhaler  Commonly known as:  PROVENTIL HFA;VENTOLIN HFA  Inhale 2 puffs into the lungs every 6 (six) hours as needed for wheezing.     amLODipine 10 MG tablet  Commonly known as:  NORVASC  Take 10 mg by mouth daily.     aspirin 81 MG EC tablet  Take 1 tablet (81 mg total) by mouth daily.     atorvastatin 40 MG tablet  Commonly known as:  LIPITOR  Take 40 mg by mouth daily.     clopidogrel 75 MG tablet  Commonly known as:  PLAVIX  Take 75 mg by mouth daily.     metFORMIN 500 MG tablet  Commonly known as:  GLUCOPHAGE  Take 500 mg by mouth 2 (two) times daily with a meal.     saxagliptin HCl 2.5 MG Tabs tablet  Commonly known as:  ONGLYZA  Take 2.5 mg by mouth daily.     tamsulosin 0.4 MG Caps  Commonly known as:  FLOMAX  Take by mouth.     valsartan 160 MG tablet  Commonly known as:  DIOVAN  Take 160 mg by mouth daily.        Patients skin is clean, dry and intact, no evidence of skin break down, pt does have a deep tissue injury to left heel. Patient transported on a stretcher by non emergent EMS,  no distress noted upon discharge.  [name], [name] C 04/08/2013 2:12 PM

## 2013-04-08 NOTE — Progress Notes (Signed)
Occupational Therapy Treatment Patient Details Name: Jerry Stanley MRN: [id number] DOB: Apr 19, 1941 Today's Date: 04/08/2013 Time: 7829-5621 OT Time Calculation (min): 38 min  OT Assessment / Plan / Recommendation Comments on Treatment Session Pt progressing since eval. Pt was in bed upon arrival.  Needed increased time for bed mobility and donning and doffing socks.  Needed several VCs for hand placement for sit<>stand. Once standing pt progressed from Min A>Min guard.  Pt needed VCs and gestures to turn water on and where to get paper towels.      Follow Up Recommendations  SNF       Equipment Recommendations  None recommended by OT       Frequency Min 2X/week   Plan Discharge plan remains appropriate    Precautions / Restrictions Precautions Precautions: Fall Restrictions Weight Bearing Restrictions: No   Pertinent Vitals/Pain No denies of pain    ADL  Grooming: Performed;Wash/dry hands;Min guard Where Assessed - Grooming: Unsupported standing Lower Body Bathing: Simulated;Moderate assistance Where Assessed - Lower Body Bathing: Supported sit to stand Lower Body Dressing: Performed;Moderate assistance Where Assessed - Lower Body Dressing: Supported sit to Pharmacist, hospital: Performed;Minimal Dentist Method: Sit to Barista: Raised toilet seat with arms (or 3-in-1 over toilet) Toileting - Clothing Manipulation and Hygiene: Performed;Minimal assistance Where Assessed - Engineer, mining and Hygiene: Standing Equipment Used: Gait belt;Rolling walker Transfers/Ambulation Related to ADLs: Pt ambulated from bed>bathroom>sink>recliner.  pt ambulated at a slow speed and needed min VCs to for hand placement for sit<>stand from recliner, bed, and toliet.  ADL Comments: Pt with decreased ability to don left sock for LB dressing.  Pt began task with posterior lean then began to maintain balance.  Pt was able to reach and  wipe bottom after tolieting with min A.        OT Goals ADL Goals ADL Goal: Grooming - Progress: Progressing toward goals ADL Goal: Lower Body Bathing - Progress: Not progressing (Same as Eval) ADL Goal: Lower Body Dressing - Progress:  (Same as eval) ADL Goal: Toilet Transfer - Progress: Progressing toward goals ADL Goal: Toileting - Clothing Manipulation - Progress: Progressing toward goals ADL Goal: Toileting - Hygiene - Progress: Progressing toward goals Miscellaneous OT Goals OT Goal: Miscellaneous Goal #1 - Progress: Not progressing (Same as eval for level, differnt technique)  Visit Information  Last OT Received On: 04/08/13 Assistance Needed: +1    Subjective Data  Subjective: I could put on my left sock by myself when I was at home(when asked about last time he could put on left sock by himself)       Cognition  Cognition Arousal/Alertness: Awake/alert Behavior During Therapy: WFL for tasks assessed/performed Overall Cognitive Status: Within Functional Limits for tasks assessed    Mobility  Bed Mobility Bed Mobility: Rolling Right;Right Sidelying to Sit;Sitting - Scoot to Edge of Bed Rolling Right: 6: Modified independent (Device/Increase time);With rail Right Sidelying to Sit: With rails;HOB flat;3: Mod assist Sitting - Scoot to Edge of Bed: With rail;3: Mod assist Details for Bed Mobility Assistance: Pt was able to roll right with increased time but needed A once got in sidelying to sit.  When asked to Scoot to EOB needed several VCs.  Pt was able to do scoot himself minimally but needed Mod A for the rest of the way--alternating leaning one way and scooting out the opposite hip. Transfers Transfers: Sit to Stand;Stand to Sit Sit to Stand: 4: Min assist;Without upper extremity assist;From bed Stand  to Sit: 4: Min assist;To chair/3-in-1;To toilet;With armrests;With upper extremity assist Details for Transfer Assistance: Increased time needed and several Vcs for hand  placement(from bed>toliet>recliner) and to bring RW with him when ambulating around room.       Balance Balance Balance Assessed: Yes Dynamic Sitting Balance Dynamic Sitting - Balance Support: Feet supported;During functional activity Dynamic Sitting - Level of Assistance: 4: Min assist Dynamic Sitting Balance - Compensations: sat for 7 mins donning and doffing socks Dynamic Standing Balance Dynamic Standing - Balance Support: During functional activity;No upper extremity supported Dynamic Standing - Level of Assistance: Other (comment) (min guard) Dynamic Standing - Balance Activities: Lateral lean/weight shifting;Reaching for objects Dynamic Standing - Comments:  for 1 min at sink washing hands and reaching for paper towel and soap   End of Session OT - End of Session Equipment Utilized During Treatment: Gait belt Activity Tolerance: Patient tolerated treatment well Patient left: in chair;with call bell/phone within reach;with family/visitor present Nurse Communication:  (pt had bowel movement and ready for BP)       Makyi Ledo, [place] 04/08/2013, 2:05 PM

## 2013-04-08 NOTE — Progress Notes (Signed)
Internal Medicine Teaching Service Attending Note Date: 04/08/2013  Patient name: Jerry Stanley  Medical record number: [id number]  Date of birth: [birth year]    This patient has been seen and discussed with the house staff. Please see their note for complete details. I concur with their findings with the following additions/corrections: Pt is medically stable for D/C today to [facility]. Outstanding issues to address as outpt / at SNF 1. Systolic murmur - pt's rehab records do not mention a hear murmur. They do mention [facility]. Pt almost certainly have had an ECHO with his CVA W/U. Will cont to try to obtain those records. Pt is asymptomatic. 2. Increased AST / ALT - follow trend in outpt setting. Otherwise, per Dr Gwenlyn Fudge D/C summary.  BUTCHER,ELIZABETH 04/08/2013, 11:48 AM

## 2013-04-08 NOTE — Progress Notes (Signed)
I have reviewed and agree with this note.  Cathy Cline Draheim, OTR/L [phone] 04/08/2013   

## 2013-04-10 ENCOUNTER — Non-Acute Institutional Stay (SKILLED_NURSING_FACILITY): Payer: Medicare Other | Admitting: Nurse Practitioner

## 2013-04-10 ENCOUNTER — Encounter: Payer: Self-pay | Admitting: Nurse Practitioner

## 2013-04-10 DIAGNOSIS — I10 Essential (primary) hypertension: Secondary | ICD-10-CM

## 2013-04-10 DIAGNOSIS — R32 Unspecified urinary incontinence: Secondary | ICD-10-CM

## 2013-04-10 DIAGNOSIS — E785 Hyperlipidemia, unspecified: Secondary | ICD-10-CM

## 2013-04-10 DIAGNOSIS — N390 Urinary tract infection, site not specified: Secondary | ICD-10-CM

## 2013-04-10 DIAGNOSIS — E119 Type 2 diabetes mellitus without complications: Secondary | ICD-10-CM

## 2013-04-10 DIAGNOSIS — Z8673 Personal history of transient ischemic attack (TIA), and cerebral infarction without residual deficits: Secondary | ICD-10-CM

## 2013-04-10 NOTE — Assessment & Plan Note (Signed)
Patient is stable; continue current regimen. Will cont cbgs ACHS and add additional medications as needed

## 2013-04-10 NOTE — Progress Notes (Signed)
Patient ID: Jerry Stanley, male   DOB: 02-27-1941, 72 y.o.   MRN: [id number]  Chief Complaint: follow up after hospitalization   HPI:  Pt is a 72 year old man with PMH of stroke, DM II, HTN, history of stroke out of state (in [place]) with rehab in [facility]. where he had previously been living with girlfriend but due to an unsafe living environment his sister moved him to be with her. Supposedly the patient has been incontinence with stools and bladder since the stroke. It is unclear if he is unable to go or if he is simply unable to get to the bathroom and is having accidents on himself. He was found to have UTI and admitted to hospital for treatment. Culture showed growth for providencia which was susceptible to Ceftriaxone and he did receive total 4 day course. Over the course of hospitalization it was reported his mental status did clear and he regained quite an appetite. He has now been discharge to [facility] for long term care. Pt is doing well in the current setting. Pt has no complaints and staff has no concerns at this time.   Hypertension - blood pressures well controlled on his home medications- Norvasc 10 mg and Diovan 160 mg  History of stroke -  Aspirin to 81 mg, Plavix, and  Lipitor 40 mg   Hyperlipemia - continued on statin  Diabetes mellitus type 2- per hospital reports patient's HgA1c was 7.7 however this is thought to reflect the period of time he went without any of his medications at home. At home he takes metformin, onglyza, humulin 10 units with meals. In the hospital he was requiring only about 2-3 units with meals and may be wise to trial him on his home oral medications. Will monitor blood sugars and decide about insulins after he can have his sugars monitored on oral medications. Blood sugars are currently being monitored ACHS  Review of Systems:   Review of Systems  Constitutional: Negative for malaise/fatigue.  Respiratory: Negative for shortness of breath.   Cardiovascular:  Negative for chest pain, palpitations and leg swelling.  Gastrointestinal: Negative for heartburn, diarrhea and constipation.  Genitourinary: Negative for dysuria.  Musculoskeletal: Negative for myalgias.  Skin: Negative for itching and rash.     Medications: Patient's Medications  New Prescriptions   No medications on file  Previous Medications   ALBUTEROL (PROVENTIL HFA;VENTOLIN HFA) 108 (90 BASE) MCG/ACT INHALER    Inhale 2 puffs into the lungs every 6 (six) hours as needed for wheezing.   AMLODIPINE (NORVASC) 10 MG TABLET    Take 10 mg by mouth daily.   ASPIRIN EC 81 MG EC TABLET    Take 1 tablet (81 mg total) by mouth daily.   ATORVASTATIN (LIPITOR) 40 MG TABLET    Take 40 mg by mouth daily.   CLOPIDOGREL (PLAVIX) 75 MG TABLET    Take 75 mg by mouth daily.   METFORMIN (GLUCOPHAGE) 500 MG TABLET    Take 500 mg by mouth 2 (two) times daily with a meal.   SAXAGLIPTIN HCL (ONGLYZA) 2.5 MG TABS TABLET    Take 2.5 mg by mouth daily.   TAMSULOSIN (FLOMAX) 0.4 MG CAPS    Take by mouth.   VALSARTAN (DIOVAN) 160 MG TABLET    Take 160 mg by mouth daily.  Modified Medications   No medications on file  Discontinued Medications   No medications on file     Physical Exam: Physical Exam  Constitutional: He is well-developed,  well-nourished, and in no distress. No distress.  HENT:  Head: Normocephalic and atraumatic.  Eyes: Conjunctivae and EOM are normal. Pupils are equal, round, and reactive to light.  Neck: Normal range of motion. Neck supple.  Cardiovascular: Normal rate, regular rhythm and normal heart sounds.   Pulmonary/Chest: Effort normal and breath sounds normal.  Abdominal: Soft. Bowel sounds are normal.  Neurological: He is alert.  Skin: Skin is warm and dry. He is not diaphoretic.  Psychiatric:  Flat affect, answers questions minimally     Filed Vitals:   04/10/13 1526  BP: 113/73  Pulse: 73  Temp: 98 F (36.7 C)  Resp: 20      Labs reviewed: Basic Metabolic  Panel:  Recent Labs  04/03/13 1243 04/04/13 0544  NA 139 136  K 3.1* 3.5  CL 102 105  CO2 29 23  GLUCOSE 142* 177*  BUN 14 11  CREATININE 0.73 0.60  CALCIUM 9.2 8.5    Liver Function Tests:  Recent Labs  04/03/13 1243 04/04/13 0544  AST 107* 72*  ALT 91* 70*  ALKPHOS 74 63  BILITOT 0.5 0.5  PROT 8.0 6.8  ALBUMIN 3.1* 2.5*    CBC:  Recent Labs  04/03/13 1243 04/04/13 0544  WBC 6.4 5.9  NEUTROABS 3.3  --   HGB 14.4 13.3  HCT 39.8 37.4*  MCV 88.6 88.0  PLT 180 150    Assessment/Plan Hypertension Patient is stable; continue current regimen. Will monitor and make changes as necessary.   Diabetes mellitus type 2 in nonobese Patient is stable; continue current regimen. Will cont cbgs ACHS and add additional medications as needed   UTI (lower urinary tract infection) Improved- pt currently asymptomatic   Urinary incontinence Unchanged   CVA late effect-  Stable, cont Aspirin to 81 mg, Plavix, and  Lipitor 40 mg   Hyperlipemia - continued on statin

## 2013-04-10 NOTE — Assessment & Plan Note (Signed)
Patient is stable; continue current regimen. Will monitor and make changes as necessary.  

## 2013-04-10 NOTE — Assessment & Plan Note (Signed)
Improved- pt currently asymptomatic

## 2013-04-10 NOTE — Assessment & Plan Note (Signed)
Unchanged

## 2013-05-06 ENCOUNTER — Non-Acute Institutional Stay (SKILLED_NURSING_FACILITY): Payer: Medicare Other | Admitting: Nurse Practitioner

## 2013-05-06 ENCOUNTER — Encounter: Payer: Self-pay | Admitting: Nurse Practitioner

## 2013-05-06 DIAGNOSIS — E119 Type 2 diabetes mellitus without complications: Secondary | ICD-10-CM

## 2013-05-06 DIAGNOSIS — I10 Essential (primary) hypertension: Secondary | ICD-10-CM

## 2013-05-06 DIAGNOSIS — E785 Hyperlipidemia, unspecified: Secondary | ICD-10-CM

## 2013-05-06 DIAGNOSIS — Z8673 Personal history of transient ischemic attack (TIA), and cerebral infarction without residual deficits: Secondary | ICD-10-CM

## 2013-05-06 NOTE — Assessment & Plan Note (Signed)
Patients hyperlpidemia is stable; continue current regimen. Will monitor and make changes as necessary. Will check CMP

## 2013-05-06 NOTE — Progress Notes (Signed)
Patient ID: Jerry Stanley, male   DOB: Jul 28, 1941, 72 y.o.   MRN: [id number]  Nursing Home Location:  [facility]   Place of Service: SNF (31)   Chief Complaint: medical management of chronic conditions  HPI:  72 year old man with PMH of stroke, DM II, HTN, history of stroke who is now are [facility] for LTC Pt is doing well in the current setting and conts to work with therapy. Pt has no complaints and staff has no concerns at this time.   Hypertension - blood pressures well controlled on current medications- Norvasc 10 mg and Diovan 160 mg  History of stroke - Aspirin to 81 mg, Plavix, and Lipitor 40 mg  Hyperlipemia - on statin- no c/o of myalgias  Diabetes mellitus type 2- . stabe at this time on metformin  Review of Systems:  Review of Systems  Constitutional: Negative for fever, chills and malaise/fatigue.  Respiratory: Negative for shortness of breath.   Cardiovascular: Negative for chest pain and palpitations.  Gastrointestinal: Negative for abdominal pain, diarrhea and constipation.  Genitourinary: Negative for dysuria.  Musculoskeletal: Negative for myalgias.  Skin: Negative.   Psychiatric/Behavioral: Negative for depression. The patient is not nervous/anxious and does not have insomnia.      Medications: Patient's Medications  New Prescriptions   No medications on file  Previous Medications   ALBUTEROL (PROVENTIL HFA;VENTOLIN HFA) 108 (90 BASE) MCG/ACT INHALER    Inhale 2 puffs into the lungs every 6 (six) hours as needed for wheezing.   AMLODIPINE (NORVASC) 10 MG TABLET    Take 10 mg by mouth daily.   ASPIRIN EC 81 MG EC TABLET    Take 1 tablet (81 mg total) by mouth daily.   ATORVASTATIN (LIPITOR) 40 MG TABLET    Take 40 mg by mouth daily.   CLOPIDOGREL (PLAVIX) 75 MG TABLET    Take 75 mg by mouth daily.   METFORMIN (GLUCOPHAGE) 500 MG TABLET    Take 500 mg by mouth 2 (two) times daily with a meal.   SAXAGLIPTIN HCL (ONGLYZA) 2.5 MG TABS TABLET     Take 2.5 mg by mouth daily.   TAMSULOSIN (FLOMAX) 0.4 MG CAPS    Take by mouth.   VALSARTAN (DIOVAN) 160 MG TABLET    Take 160 mg by mouth daily.  Modified Medications   No medications on file  Discontinued Medications   No medications on file     Physical Exam:  Filed Vitals:   05/06/13 1224  BP: 109/73  Pulse: 73  Temp: 97.7 F (36.5 C)  Resp: 19  Weight: 156 lb (70.761 kg)   Physical Exam  Constitutional: No distress.  HENT:  Head: Normocephalic and atraumatic.  Mouth/Throat: Oropharynx is clear and moist. No oropharyngeal exudate.  Eyes: EOM are normal. Pupils are equal, round, and reactive to light.  Neck: Normal range of motion. Neck supple. No thyromegaly present.  Cardiovascular: Normal rate, regular rhythm and normal heart sounds.   Pulmonary/Chest: Effort normal and breath sounds normal.  Abdominal: Soft. Bowel sounds are normal.  Musculoskeletal: He exhibits no edema and no tenderness.  Lymphadenopathy:    He has no cervical adenopathy.  Neurological: He is alert.  Skin: Skin is warm and dry. He is not diaphoretic.      Labs reviewed:  Lipid Profile       Result: 04/25/2013 2:19 AM    ( Status: F )       C     Cholesterol  91        0-200  mg/dL  SLN  C     Triglyceride  122        <150  mg/dL  SLN       HDL Cholesterol  32     L  >39  mg/dL  SLN       Total Chol/HDL Ratio  2.8         Ratio  SLN       VLDL Cholesterol (Calc)  24        0-40  mg/dL  SLN       LDL Cholesterol (Calc)  35        0-99  mg/dL  SLN  C    Liver Profile       Result: 04/25/2013 2:19 AM    ( Status: F )            Bilirubin, Total  0.4        0.3-1.2  mg/dL  SLN       Bilirubin, Direct  0.1        0.0-0.3  mg/dL  SLN       Indirect Bilirubin  0.3        0.0-0.9  mg/dL  SLN       Alkaline Phosphatase  77        39-117  U/L  SLN       AST/SGOT  53     H  0-37  U/L  SLN       ALT/SGPT  60     H  0-53  U/L  SLN       Total Protein  7.6        6.0-8.3  g/dL  SLN       Albumin  3.4      L  3.5-5.2  g/dL  SLN      Hemoglobin N5A       Result: 04/25/2013 1:12 AM    ( Status: F )            Hemoglobin A1C  7.1     H  <5.7  %  SLN  C     Estimated Average Glucose  157     H    Assessment/Plan Diabetes mellitus type 2 in nonobese Last A1c was 7.1 will cont current plan of care- CBGs q am   Hypertension bp within good control will stop norvasc and cont to monitor   Hyperlipemia Patients hyperlpidemia is stable; continue current regimen. Will monitor and make changes as necessary. Will check CMP    History of stroke conts to work with therapy- on asa and plavix will get CBC at this time

## 2013-05-06 NOTE — Assessment & Plan Note (Signed)
Last A1c was 7.1 will cont current plan of care- CBGs q am

## 2013-05-06 NOTE — Assessment & Plan Note (Signed)
bp within good control will stop norvasc and cont to monitor

## 2013-05-06 NOTE — Assessment & Plan Note (Signed)
conts to work with therapy- on asa and plavix will get CBC at this time

## 2013-06-13 ENCOUNTER — Encounter: Payer: Self-pay | Admitting: Nurse Practitioner

## 2013-06-13 ENCOUNTER — Non-Acute Institutional Stay (SKILLED_NURSING_FACILITY): Payer: Medicare Other | Admitting: Nurse Practitioner

## 2013-06-13 DIAGNOSIS — E785 Hyperlipidemia, unspecified: Secondary | ICD-10-CM

## 2013-06-13 DIAGNOSIS — E119 Type 2 diabetes mellitus without complications: Secondary | ICD-10-CM

## 2013-06-13 DIAGNOSIS — I10 Essential (primary) hypertension: Secondary | ICD-10-CM

## 2013-06-13 NOTE — Progress Notes (Signed)
Patient ID: Jerry Stanley, male   DOB: [birth year], 72 y.o.   MRN: [id number]  Nursing Home Location:  [facility]   Place of Service: SNF (31)   Chief Complaint: medical management of chronic conditions  HPI:  72 year old man with PMH of stroke, DM II, HTN, history of stroke who is now are [facility] for LTC  Pt is doing well in the current setting. Pt has no complaints and staff with no concerns at this time.  Hypertension - blood pressures well controlled on current medications- Norvasc 10 mg and Diovan 160 mg  History of stroke - Aspirin to 81 mg, Plavix, and Lipitor 40 mg  Hyperlipemia - on statin- no c/o of myalgias  Diabetes mellitus type 2- . stabe at this time on metformin -- hgb a1c 7.1   Review of Systems:  Review of Systems  Constitutional: Negative for fever, chills and weight loss.  HENT: Negative.   Respiratory: Negative for cough and shortness of breath.   Cardiovascular: Negative for chest pain and leg swelling.  Gastrointestinal: Negative for heartburn, abdominal pain, diarrhea and constipation.  Genitourinary: Negative for dysuria, urgency and frequency.  Musculoskeletal: Negative for myalgias and falls.  Skin: Negative.   Neurological: Negative for dizziness, tingling and weakness.  Psychiatric/Behavioral: Negative for depression.     Medications: Patient's Medications  New Prescriptions   No medications on file  Previous Medications   ALBUTEROL (PROVENTIL HFA;VENTOLIN HFA) 108 (90 BASE) MCG/ACT INHALER    Inhale 2 puffs into the lungs every 6 (six) hours as needed for wheezing.   AMLODIPINE (NORVASC) 10 MG TABLET    Take 10 mg by mouth daily.   ASPIRIN EC 81 MG EC TABLET    Take 1 tablet (81 mg total) by mouth daily.   ATORVASTATIN (LIPITOR) 40 MG TABLET    Take 40 mg by mouth daily.   CLOPIDOGREL (PLAVIX) 75 MG TABLET    Take 75 mg by mouth daily.   METFORMIN (GLUCOPHAGE) 500 MG TABLET    Take 500 mg by mouth 2 (two) times daily with a  meal.   SAXAGLIPTIN HCL (ONGLYZA) 2.5 MG TABS TABLET    Take 2.5 mg by mouth daily.   TAMSULOSIN (FLOMAX) 0.4 MG CAPS    Take by mouth.   VALSARTAN (DIOVAN) 160 MG TABLET    Take 160 mg by mouth daily.  Modified Medications   No medications on file  Discontinued Medications   No medications on file     Physical Exam:  Filed Vitals:   06/13/13 1258  BP: 142/81  Pulse: 70  Temp: 97.8 F (36.6 C)  Resp: 18    Physical Exam  Constitutional: He is well-developed, well-nourished, and in no distress. No distress.  HENT:  Head: Normocephalic and atraumatic.  Eyes: Conjunctivae and EOM are normal. Pupils are equal, round, and reactive to light.  Neck: Normal range of motion. Neck supple. No thyromegaly present.  Cardiovascular: Normal rate, regular rhythm and normal heart sounds.   Pulmonary/Chest: Effort normal and breath sounds normal. No respiratory distress.  Abdominal: Soft. Bowel sounds are normal. He exhibits no distension. There is no tenderness.  Musculoskeletal: Normal range of motion. He exhibits no edema and no tenderness.  Neurological: He is alert.  Skin: Skin is warm and dry. He is not diaphoretic.      Labs reviewed: Admission on 04/03/2013, Discharged on 04/08/2013  Component Date Value Range Status  . WBC 04/03/2013 6.4  4.0 - 10.5 K/uL  Final  . RBC 04/03/2013 4.49  4.22 - 5.81 MIL/uL Final  . Hemoglobin 04/03/2013 14.4  13.0 - 17.0 g/dL Final  . HCT 30/86/5784 39.8  39.0 - 52.0 % Final  . MCV 04/03/2013 88.6  78.0 - 100.0 fL Final  . MCH 04/03/2013 32.1  26.0 - 34.0 pg Final  . MCHC 04/03/2013 36.2* 30.0 - 36.0 g/dL Final  . RDW 69/62/9528 12.4  11.5 - 15.5 % Final  . Platelets 04/03/2013 180  150 - 400 K/uL Final  . Neutrophils Relative % 04/03/2013 52  43 - 77 % Final  . Neutro Abs 04/03/2013 3.3  1.7 - 7.7 K/uL Final  . Lymphocytes Relative 04/03/2013 37  12 - 46 % Final  . Lymphs Abs 04/03/2013 2.3  0.7 - 4.0 K/uL Final  . Monocytes Relative  04/03/2013 11  3 - 12 % Final  . Monocytes Absolute 04/03/2013 0.7  0.1 - 1.0 K/uL Final  . Eosinophils Relative 04/03/2013 0  0 - 5 % Final  . Eosinophils Absolute 04/03/2013 0.0  0.0 - 0.7 K/uL Final  . Basophils Relative 04/03/2013 0  0 - 1 % Final  . Basophils Absolute 04/03/2013 0.0  0.0 - 0.1 K/uL Final  . Sodium 04/03/2013 139  135 - 145 mEq/L Final  . Potassium 04/03/2013 3.1* 3.5 - 5.1 mEq/L Final  . Chloride 04/03/2013 102  96 - 112 mEq/L Final  . CO2 04/03/2013 29  19 - 32 mEq/L Final  . Glucose, Bld 04/03/2013 142* 70 - 99 mg/dL Final  . BUN 41/32/4401 14  6 - 23 mg/dL Final  . Creatinine, Ser 04/03/2013 0.73  0.50 - 1.35 mg/dL Final  . Calcium 02/72/5366 9.2  8.4 - 10.5 mg/dL Final  . Total Protein 04/03/2013 8.0  6.0 - 8.3 g/dL Final  . Albumin 44/02/4741 3.1* 3.5 - 5.2 g/dL Final  . AST 59/56/3875 107* 0 - 37 U/L Final  . ALT 04/03/2013 91* 0 - 53 U/L Final  . Alkaline Phosphatase 04/03/2013 74  39 - 117 U/L Final  . Total Bilirubin 04/03/2013 0.5  0.3 - 1.2 mg/dL Final  . GFR calc non Af Amer 04/03/2013 >90  >90 mL/min Final  . GFR calc Af Amer 04/03/2013 >90  >90 mL/min Final   Comment:                                 The eGFR has been calculated                          using the CKD EPI equation.                          This calculation has not been                          validated in all clinical                          situations.                          eGFR's persistently                          <90 mL/min signify  possible Chronic Kidney Disease.  . Total CK 04/03/2013 136  7 - 232 U/L Final  . Color, Urine 04/03/2013 YELLOW  YELLOW Final  . APPearance 04/03/2013 CLOUDY* CLEAR Final  . Specific Gravity, Urine 04/03/2013 1.019  1.005 - 1.030 Final  . pH 04/03/2013 6.5  5.0 - 8.0 Final  . Glucose, UA 04/03/2013 NEGATIVE  NEGATIVE mg/dL Final  . Hgb urine dipstick 04/03/2013 NEGATIVE  NEGATIVE Final  . Bilirubin Urine 04/03/2013  NEGATIVE  NEGATIVE Final  . Ketones, ur 04/03/2013 NEGATIVE  NEGATIVE mg/dL Final  . Protein, ur 16/09/9603 NEGATIVE  NEGATIVE mg/dL Final  . Urobilinogen, UA 04/03/2013 1.0  0.0 - 1.0 mg/dL Final  . Nitrite 54/08/8118 NEGATIVE  NEGATIVE Final  . Leukocytes, UA 04/03/2013 LARGE* NEGATIVE Final  . Specimen Description 04/03/2013 URINE, RANDOM   Final  . Special Requests 04/03/2013 NONE   Final  . Culture  Setup Time 04/03/2013 04/04/2013 00:32   Final  . Colony Count 04/03/2013 NO GROWTH   Final  . Culture 04/03/2013 NO GROWTH   Final  . Report Status 04/03/2013 04/05/2013 FINAL   Final  . Sodium 04/04/2013 136  135 - 145 mEq/L Final  . Potassium 04/04/2013 3.5  3.5 - 5.1 mEq/L Final  . Chloride 04/04/2013 105  96 - 112 mEq/L Final  . CO2 04/04/2013 23  19 - 32 mEq/L Final  . Glucose, Bld 04/04/2013 177* 70 - 99 mg/dL Final  . BUN 14/78/2956 11  6 - 23 mg/dL Final  . Creatinine, Ser 04/04/2013 0.60  0.50 - 1.35 mg/dL Final  . Calcium 21/30/8657 8.5  8.4 - 10.5 mg/dL Final  . Total Protein 04/04/2013 6.8  6.0 - 8.3 g/dL Final  . Albumin 84/69/6295 2.5* 3.5 - 5.2 g/dL Final  . AST 28/41/3244 72* 0 - 37 U/L Final  . ALT 04/04/2013 70* 0 - 53 U/L Final  . Alkaline Phosphatase 04/04/2013 63  39 - 117 U/L Final  . Total Bilirubin 04/04/2013 0.5  0.3 - 1.2 mg/dL Final  . GFR calc non Af Amer 04/04/2013 >90  >90 mL/min Final  . GFR calc Af Amer 04/04/2013 >90  >90 mL/min Final   Comment:                                 The eGFR has been calculated                          using the CKD EPI equation.                          This calculation has not been                          validated in all clinical                          situations.                          eGFR's persistently                          <90 mL/min signify  possible Chronic Kidney Disease.  . WBC 04/04/2013 5.9  4.0 - 10.5 K/uL Final  . RBC 04/04/2013 4.25  4.22 - 5.81 MIL/uL Final  .  Hemoglobin 04/04/2013 13.3  13.0 - 17.0 g/dL Final  . HCT 16/09/9603 37.4* 39.0 - 52.0 % Final  . MCV 04/04/2013 88.0  78.0 - 100.0 fL Final  . MCH 04/04/2013 31.3  26.0 - 34.0 pg Final  . MCHC 04/04/2013 35.6  30.0 - 36.0 g/dL Final  . RDW 54/08/8118 12.5  11.5 - 15.5 % Final  . Platelets 04/04/2013 150  150 - 400 K/uL Final  . Hemoglobin A1C 04/04/2013 7.7* <5.7 % Final   Comment: (NOTE)                                                                                                                         According to the ADA Clinical Practice Recommendations for 2011, when                          HbA1c is used as a screening test:                           >=6.5%   Diagnostic of Diabetes Mellitus                                    (if abnormal result is confirmed)                          5.7-6.4%   Increased risk of developing Diabetes Mellitus                          References:Diagnosis and Classification of Diabetes Mellitus,Diabetes                          Care,2011,34(Suppl 1):S62-S69 and Standards of Medical Care in                                  Diabetes - 2011,Diabetes Care,2011,34 (Suppl 1):S11-S61.  . Mean Plasma Glucose 04/04/2013 174* <117 mg/dL Final  . Glucose-Capillary 04/03/2013 224* 70 - 99 mg/dL Final  . Comment 1 14/78/2956 Documented in Chart   Final  . Comment 2 04/03/2013 Notify RN   Final  . Squamous Epithelial / LPF 04/03/2013 FEW* RARE Final  . WBC, UA 04/03/2013 TOO NUMEROUS TO COUNT  <3 WBC/hpf Final  . Bacteria, UA 04/03/2013 FEW* RARE Final  . Glucose-Capillary 04/04/2013 172* 70 - 99 mg/dL Final  . Glucose-Capillary 04/04/2013 163* 70 - 99 mg/dL Final  . Glucose-Capillary 04/04/2013 172* 70 - 99 mg/dL Final  . Glucose-Capillary 04/04/2013 214* 70 - 99 mg/dL Final  .  Glucose-Capillary 04/05/2013 166* 70 - 99 mg/dL Final  . Comment 1 40/98/1191 Documented in Chart   Final  . Comment 2 04/05/2013 Notify RN   Final  . Glucose-Capillary 04/05/2013  214* 70 - 99 mg/dL Final  . Comment 1 47/82/9562 Documented in Chart   Final  . Comment 2 04/05/2013 Notify RN   Final  . Glucose-Capillary 04/05/2013 222* 70 - 99 mg/dL Final  . Comment 1 13/07/6577 Documented in Chart   Final  . Comment 2 04/05/2013 Notify RN   Final  . Glucose-Capillary 04/05/2013 144* 70 - 99 mg/dL Final  . Comment 1 46/96/2952 Notify RN   Final  . Glucose-Capillary 04/06/2013 160* 70 - 99 mg/dL Final  . Comment 1 84/13/2440 Documented in Chart   Final  . Comment 2 04/06/2013 Notify RN   Final  . Glucose-Capillary 04/06/2013 165* 70 - 99 mg/dL Final  . Comment 1 10/14/2535 Documented in Chart   Final  . Comment 2 04/06/2013 Notify RN   Final  . Glucose-Capillary 04/06/2013 218* 70 - 99 mg/dL Final  . Comment 1 64/40/3474 Documented in Chart   Final  . Comment 2 04/06/2013 Notify RN   Final  . Glucose-Capillary 04/06/2013 196* 70 - 99 mg/dL Final  . Comment 1 25/95/6387 Notify RN   Final  . Glucose-Capillary 04/07/2013 158* 70 - 99 mg/dL Final  . Glucose-Capillary 04/07/2013 231* 70 - 99 mg/dL Final  . Glucose-Capillary 04/07/2013 180* 70 - 99 mg/dL Final  . Glucose-Capillary 04/07/2013 176* 70 - 99 mg/dL Final  . Comment 1 56/43/3295 Documented in Chart   Final  . Comment 2 04/07/2013 Notify RN   Final  . Glucose-Capillary 04/08/2013 168* 70 - 99 mg/dL Final  . Comment 1 18/84/1660 Documented in Chart   Final  . Comment 2 04/08/2013 Notify RN   Final  . Glucose-Capillary 04/08/2013 237* 70 - 99 mg/dL Final  . Comment 1 63/12/6008 Documented in Chart   Final  . Comment 2 04/08/2013 Notify RN   Final  Admission on 04/03/2013, Discharged on 04/03/2013  Component Date Value Range Status  . Color, Urine 04/03/2013 YELLOW  YELLOW Final  . APPearance 04/03/2013 TURBID* CLEAR Final  . Specific Gravity, Urine 04/03/2013 1.022  1.005 - 1.030 Final  . pH 04/03/2013 8.5* 5.0 - 8.0 Final  . Glucose, UA 04/03/2013 100* NEGATIVE mg/dL Final  . Hgb urine dipstick 04/03/2013  NEGATIVE  NEGATIVE Final  . Bilirubin Urine 04/03/2013 NEGATIVE  NEGATIVE Final  . Ketones, ur 04/03/2013 NEGATIVE  NEGATIVE mg/dL Final  . Protein, ur 93/23/5573 30* NEGATIVE mg/dL Final  . Urobilinogen, UA 04/03/2013 1.0  0.0 - 1.0 mg/dL Final  . Nitrite 22/01/5426 POSITIVE* NEGATIVE Final  . Leukocytes, UA 04/03/2013 LARGE* NEGATIVE Final  . Squamous Epithelial / LPF 04/03/2013 RARE  RARE Final  . WBC, UA 04/03/2013 7-10  <3 WBC/hpf Final  . Bacteria, UA 04/03/2013 MANY* RARE Final  . Crystals 04/03/2013 TRIPLE PHOSPHATE CRYSTALS* NEGATIVE Final  . Urine-Other 04/03/2013 AMORPHOUS URATES/PHOSPHATES   Final  . Specimen Description 04/03/2013 URINE, RANDOM   Final  . Special Requests 04/03/2013 NONE   Final  . Culture  Setup Time 04/03/2013 04/03/2013 12:47   Final  . Colony Count 04/03/2013 >=100,000 COLONIES/ML   Final  . Culture 04/03/2013 PROVIDENCIA RETTGERI   Final  . Report Status 04/03/2013 04/05/2013 FINAL   Final  . Organism ID, Bacteria 04/03/2013 PROVIDENCIA RETTGERI   Final    Lipid Profile  Result: 05/27/2013 11:24 AM    ( Status: F )       C     Cholesterol  117        0-200  mg/dL  SLN  C     Triglyceride  236     H  <150  mg/dL  SLN       HDL Cholesterol  35     L  >39  mg/dL  SLN       Total Chol/HDL Ratio  3.3         Ratio  SLN       VLDL Cholesterol (Calc)  47     H  0-40  mg/dL  SLN       LDL Cholesterol (Calc)  35        0-99  mg/dL  SLN  C    Liver Profile       Result: 05/27/2013 11:24 AM    ( Status: F )            Bilirubin, Total  0.3        0.3-1.2  mg/dL  SLN       Bilirubin, Direct  0.1        0.0-0.3  mg/dL  SLN       Indirect Bilirubin  0.2        0.0-0.9  mg/dL  SLN       Alkaline Phosphatase  82        39-117  U/L  SLN       AST/SGOT  89     H  0-37  U/L  SLN       ALT/SGPT  99     H  0-53  U/L  SLN       Total Protein  6.9        6.0-8.3  g/dL  SLN       Albumin  3.3     L  3.5-5.2  g/dL  SLN      Hemoglobin Z6X       Result: 05/27/2013  1:03 PM    ( Status: F )            Hemoglobin A1C  7.0     H  <5.7  %  SLN  C     Estimated Average Glucose  154    )       Result: 05/07/2013 2:36 PM    ( Status: F )            WBC  5.6        4.0-10.5  K/uL  SLN       RBC  4.01     L  4.22-5.81  MIL/uL  SLN       Hemoglobin  12.5     L  13.0-17.0  g/dL  SLN       Hematocrit  36.0     L  39.0-52.0  %  SLN       MCV  89.8        78.0-100.0  fL  SLN       MCH  31.2        26.0-34.0  pg  SLN       MCHC  34.7        30.0-36.0  g/dL  SLN       RDW  09.6        11.5-15.5  %  SLN  Platelet Count  151        150-400  K/uL  SLN      Comprehensive Metabolic Panel       Result: 05/07/2013 3:06 PM    ( Status: F )            Sodium  136        135-145  mEq/L  SLN       Potassium  3.7        3.5-5.3  mEq/L  SLN       Chloride  103        96-112  mEq/L  SLN       CO2  27        19-32  mEq/L  SLN       Glucose  133     H  70-99  mg/dL  SLN       BUN  13        6-23  mg/dL  SLN       Creatinine  0.79        0.50-1.35  mg/dL  SLN       Bilirubin, Total  0.4        0.3-1.2  mg/dL  SLN       Alkaline Phosphatase  72        39-117  U/L  SLN       AST/SGOT  59     H  0-37  U/L  SLN       ALT/SGPT  73     H  0-53  U/L  SLN       Total Protein  6.8        6.0-8.3  g/dL  SLN       Albumin  3.2     L  3.5-5.2  g/dL  SLN       Calcium  8.7           Assessment/Plan     1.   Diabetes mellitus type 2 in nonobese 250.00     Stable- blood sugars been 111-175 in the am; to cont current medications   2.   Hyperlipemia 272.4     Lipids are stable on current medications   3.   Hypertension  Blood pressure remains satisfactory

## 2013-07-08 ENCOUNTER — Non-Acute Institutional Stay (SKILLED_NURSING_FACILITY): Payer: Medicare Other | Admitting: Nurse Practitioner

## 2013-07-08 ENCOUNTER — Encounter: Payer: Self-pay | Admitting: Nurse Practitioner

## 2013-07-08 DIAGNOSIS — N390 Urinary tract infection, site not specified: Secondary | ICD-10-CM

## 2013-07-08 DIAGNOSIS — R413 Other amnesia: Secondary | ICD-10-CM

## 2013-07-08 DIAGNOSIS — Z8673 Personal history of transient ischemic attack (TIA), and cerebral infarction without residual deficits: Secondary | ICD-10-CM

## 2013-07-08 DIAGNOSIS — E119 Type 2 diabetes mellitus without complications: Secondary | ICD-10-CM

## 2013-07-08 DIAGNOSIS — I10 Essential (primary) hypertension: Secondary | ICD-10-CM

## 2013-07-08 NOTE — Progress Notes (Signed)
Patient ID: Jerry Stanley, male   DOB: [birth year], 72 y.o.   MRN: [id number]  Nursing Home Location:  [facility]   Place of Service: SNF (31)  Chief Complaint  Patient presents with  . Medical Managment of Chronic Issues    HPI:  72 year old man with PMH of stroke, DM II, HTN, history of stroke who is now are [facility] for LTC  Pt is doing well in the current setting. Pt has no complaints and staff with concerns of more confusion. 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  Hypertension - blood pressures well controlled on current medications- Norvasc 10 mg and Diovan 160 mg  History of stroke - Aspirin to 81 mg, Plavix, and Lipitor 40 mg  Hyperlipemia - on statin- no c/o of myalgias  Diabetes mellitus type 2- . stabe at this time on metformin -- hgb a1c 7.0;fasting am blood sugars between 90-150   Review of Systems:  Review of Systems  Constitutional: Negative for fever, chills, weight loss and malaise/fatigue.  Respiratory: Negative for cough and shortness of breath.   Cardiovascular: Negative for chest pain and leg swelling.  Genitourinary: Positive for dysuria.  Musculoskeletal: Negative for myalgias and joint pain.  Skin: Negative.   Neurological: Negative for dizziness, weakness and headaches.  Psychiatric/Behavioral: Positive for memory loss. Negative for depression. The patient does not have insomnia.      Medications: Patient's Medications  New Prescriptions   No medications on file  Previous Medications   ALBUTEROL (PROVENTIL HFA;VENTOLIN HFA) 108 (90 BASE) MCG/ACT INHALER    Inhale 2 puffs into the lungs every 6 (six) hours as needed for wheezing.   AMLODIPINE (NORVASC) 10 MG TABLET    Take 10 mg by mouth daily.   ASPIRIN EC 81 MG EC TABLET    Take 1 tablet (81 mg total) by mouth daily.   ATORVASTATIN (LIPITOR) 40 MG TABLET    Take 40 mg by mouth daily.   CLOPIDOGREL  (PLAVIX) 75 MG TABLET    Take 75 mg by mouth daily.   METFORMIN (GLUCOPHAGE) 500 MG TABLET    Take 500 mg by mouth 2 (two) times daily with a meal.   SAXAGLIPTIN HCL (ONGLYZA) 2.5 MG TABS TABLET    Take 2.5 mg by mouth daily.   TAMSULOSIN (FLOMAX) 0.4 MG CAPS    Take by mouth.   VALSARTAN (DIOVAN) 160 MG TABLET    Take 160 mg by mouth daily.  Modified Medications   No medications on file  Discontinued Medications   No medications on file     Physical Exam:  Filed Vitals:   07/08/13 1605  BP: 142/79  Pulse: 67  Temp: 97.7 F (36.5 C)  Resp: 20   Physical Exam  Constitutional: He is well-developed, well-nourished, and in no distress. No distress.  Head: Normocephalic and atraumatic.  Eyes: Conjunctivae and EOM are normal. Pupils are equal, round, and reactive to light.  Neck: Normal range of motion. Neck supple. No thyromegaly present.  Cardiovascular: Normal rate, regular rhythm and normal heart sounds.  Pulmonary/Chest: Effort normal and breath sounds normal. No respiratory distress.  Abdominal: Soft. Bowel sounds are normal. He exhibits no distension. There is no tenderness.  Musculoskeletal: Normal range of motion. He exhibits no edema and no tenderness. Uses wheelchair to self propel in hallways Neurological: He is alert.  Skin: Skin is warm and dry. He  is not diaphoretic    Assessment/Plan 1. UTI (lower urinary tract infection) History of UTI with dysuria- will get UA C&S 2. Hypertension Stable at this time.  3. Diabetes mellitus type 2 in nonobese Patients blood sugars remain satisfactory; continue current regimen. Will monitor and make changes as necessary. 4. History of stroke Pt is stable  5. Memory loss Ongoing memory loss; Will start aricept 5 mg qhs  Labs/tests ordered Will get cmp, cbc

## 2013-07-17 DIAGNOSIS — R413 Other amnesia: Secondary | ICD-10-CM | POA: Insufficient documentation

## 2013-08-05 ENCOUNTER — Encounter: Payer: Self-pay | Admitting: Nurse Practitioner

## 2013-08-05 ENCOUNTER — Non-Acute Institutional Stay (SKILLED_NURSING_FACILITY): Payer: Medicare Other | Admitting: Nurse Practitioner

## 2013-08-05 DIAGNOSIS — I10 Essential (primary) hypertension: Secondary | ICD-10-CM

## 2013-08-05 DIAGNOSIS — N390 Urinary tract infection, site not specified: Secondary | ICD-10-CM

## 2013-08-05 DIAGNOSIS — M159 Polyosteoarthritis, unspecified: Secondary | ICD-10-CM

## 2013-08-05 DIAGNOSIS — E119 Type 2 diabetes mellitus without complications: Secondary | ICD-10-CM

## 2013-08-05 DIAGNOSIS — R413 Other amnesia: Secondary | ICD-10-CM

## 2013-08-05 MED ORDER — DONEPEZIL HCL 10 MG PO TABS: 10.0000 mg | ORAL_TABLET | Freq: Every day | ORAL | Status: AC

## 2013-08-05 MED ORDER — ACETAMINOPHEN ER 650 MG PO TBCR: EXTENDED_RELEASE_TABLET | ORAL | Status: DC

## 2013-08-05 NOTE — Progress Notes (Signed)
Patient ID: Jerry Stanley, male   DOB: 1941/10/14, 72 y.o.   MRN: [id number]  Nursing Home Location:  [facility]   Place of Service: SNF (31)  Chief Complaint  Patient presents with  . Medical Managment of Chronic Issues    HPI:  72 year old man with PMH of stroke, DM II, HTN, history of stroke who is now are [facility] for LTC  Pt is doing well in the current setting.  Hypertension - blood pressures well controlled on current medications- Norvasc 10 mg and Diovan 160 mg  History of stroke - Aspirin to 81 mg, Plavix, and Lipitor 40 mg  Hyperlipemia - on statin- no c/o of myalgias  Diabetes mellitus type 2- . stabe at this time on metformin -- hgb a1c 7.0;fasting am blood sugars between 90-150  Memory loss- ongoing; pt started on aricept 5 mg qhs last month tolerating medication without noted side effects OA- pt tolerating tylenol and reports he gets good pain relief however having freq pain     Review of Systems:   DATA OBTAINED: from patient, nurse, medical record, family member GENERAL: Feels well no fevers, fatigue, appetite changes SKIN: No itching, rash or wounds EYES: No eye pain, redness, discharge EARS: No earache, tinnitus, change in hearing NOSE: No congestion, drainage or bleeding  MOUTH/THROAT: No mouth or tooth pain, No sore throat, No difficulty chewing or swallowing  RESPIRATORY: No cough, wheezing, SOB CARDIAC: No chest pain, palpitations, lower extremity edema  GI: No abdominal pain, No N/V/D or constipation, No heartburn or reflux  GU: No dysuria, frequency or urgency, or incontinence  MUSCULOSKELETAL: having unrelieved joint pain NEUROLOGIC: Awake, alert, appropriate to situation, No change in mental status. Moves all four, no focal deficits PSYCHIATRIC: No overt anxiety or sadness. Sleeps well. No behavior issue.  AMBULATION:  [facility]   Medications: Patient's Medications  New Prescriptions   No medications on file  Previous Medications   ALBUTEROL (PROVENTIL HFA;VENTOLIN HFA) 108 (90 BASE) MCG/ACT INHALER    Inhale 2 puffs into the lungs every 6 (six) hours as needed for wheezing.   AMLODIPINE (NORVASC) 10 MG TABLET    Take 10 mg by mouth daily.   ASPIRIN EC 81 MG EC TABLET    Take 1 tablet (81 mg total) by mouth daily.   ATORVASTATIN (LIPITOR) 40 MG TABLET    Take 40 mg by mouth daily.   CLOPIDOGREL (PLAVIX) 75 MG TABLET    Take 75 mg by mouth daily.   METFORMIN (GLUCOPHAGE) 500 MG TABLET    Take 500 mg by mouth 2 (two) times daily with a meal.   SAXAGLIPTIN HCL (ONGLYZA) 2.5 MG TABS TABLET    Take 2.5 mg by mouth daily.   TAMSULOSIN (FLOMAX) 0.4 MG CAPS    Take by mouth.   VALSARTAN (DIOVAN) 160 MG TABLET    Take 160 mg by mouth daily.  Modified Medications   No medications on file  Discontinued Medications   No medications on file     Physical Exam:  Filed Vitals:   08/05/13 1628  BP: 133/80  Pulse: 80  Temp: 96.3 F (35.7 C)  Resp: 20  Weight: 161 lb 3.2 oz (73.12 kg)    GENERAL APPEARANCE: Alert, conversant. Appropriately groomed. No acute distress.  SKIN: No diaphoresis rash, or wounds HEAD: Normocephalic, atraumatic  EYES: Conjunctiva/lids clear. Pupils round, reactive. EOMs intact.  EARS: External exam WNL. Hearing grossly normal.  NOSE: No deformity or discharge.  MOUTH/THROAT: Lips w/o  lesions. Mouth and throat normal. Tongue moist, w/o lesion.  NECK: No thyroid tenderness, enlargement or nodule  RESPIRATORY: Breathing is even, unlabored. Lung sounds are clear   CARDIOVASCULAR: Heart RRR no murmurs, rubs or gallops. No peripheral edema.  ARTERIAL: radial pulse 2+  VENOUS: No varicosities. No venous stasis skin changes  GASTROINTESTINAL: Abdomen is soft, non-tender, not distended w/ normal bowel sounds. GENITOURINARY: Bladder non tender, not distended  MUSCULOSKELETAL: No abnormal joints or musculature NEUROLOGIC: Oriented to self . Cranial nerves 2-12 grossly intact. Moves all extremities no  tremor. PSYCHIATRIC: Mood and affect appropriate to situation, no behavioral issues  Labs reviewed/Significant Diagnostic Results: Lipid Profile       Result: 07/25/2013 11:16 PM    ( Status: F )       C     Cholesterol  97        0-200  mg/dL  SLN  C     Triglyceride  146        <150  mg/dL  SLN       HDL Cholesterol  30     L  >39  mg/dL  SLN       Total Chol/HDL Ratio  3.2         Ratio  SLN       VLDL Cholesterol (Calc)  29        0-40  mg/dL  SLN       LDL Cholesterol (Calc)  38        0-99  mg/dL  SLN  C    Liver Profile       Result: 07/25/2013 11:16 PM    ( Status: F )            Bilirubin, Total  0.4        0.3-1.2  mg/dL  SLN       Bilirubin, Direct  0.1        0.0-0.3  mg/dL  SLN       Indirect Bilirubin  0.3        0.0-0.9  mg/dL  SLN       Alkaline Phosphatase  82        39-117  U/L  SLN       AST/SGOT  48     H  0-37  U/L  SLN       ALT/SGPT  60     H  0-53  U/L  SLN       Total Protein  6.9        6.0-8.3  g/dL  SLN       Albumin  3.2     L  3.5-5.2  g/dL  SLN      Hemoglobin Z6X       Result: 07/25/2013 11:27 PM    ( Status: F )            Hemoglobin A1C  7.2     H  <5.7  %  SLN  C     Estimated Average Glucose  160     H  <117  mg/dL  SLN      CBC with Diff       Result: 07/09/2013 2:17 PM    ( Status: F )       C     WBC  5.4        4.0-10.5  K/uL  SLN       RBC  4.31  4.22-5.81  MIL/uL  SLN       Hemoglobin  13.1        13.0-17.0  g/dL  SLN       Hematocrit  37.4     L  39.0-52.0  %  SLN       MCV  86.8        78.0-100.0  fL  SLN       MCH  30.4        26.0-34.0  pg  SLN       MCHC  35.0        30.0-36.0  g/dL  SLN       RDW  16.1        11.5-15.5  %  SLN       Platelet Count  150        150-400  K/uL  SLN       Granulocyte %  29     L  43-77  %  SLN       Absolute Gran  1.6     L  1.7-7.7  K/uL  SLN       Lymph %  61     H  12-46  %  SLN       Absolute Lymph  3.3        0.7-4.0  K/uL  SLN       Mono %  9        3-12  %  SLN       Absolute Mono  0.5         0.1-1.0  K/uL  SLN       Eos %  1        0-5  %  SLN       Absolute Eos  0.1        0.0-0.7  K/uL  SLN       Baso %  0        0-1  %  SLN       Absolute Baso  0.0        0.0-0.1  K/uL  SLN       Smear Review  Criteria for review not met   SLN      Comprehensive Metabolic Panel       Result: 07/09/2013 3:41 PM    ( Status: F )            Sodium  138        135-145  mEq/L  SLN       Potassium  3.8        3.5-5.3  mEq/L  SLN       Chloride  106        96-112  mEq/L  SLN       CO2  24        19-32  mEq/L  SLN       Glucose  132     H  70-99  mg/dL  SLN       BUN  11        6-23  mg/dL  SLN       Creatinine  0.82        0.50-1.35  mg/dL  SLN       Bilirubin, Total  0.3        0.3-1.2  mg/dL  SLN       Alkaline Phosphatase  78  39-117  U/L  SLN       AST/SGOT  62     H  0-37  U/L  SLN       ALT/SGPT  91     H  0-53  U/L  SLN       Total Protein  7.1        6.0-8.3  g/dL  SLN       Albumin  3.4     L  3.5-5.2  g/dL  SLN       Calcium  8.8        8.4-10.5  mg/dL  SLN      TSH, Ultrasensitive       Result: 07/09/2013 3:34 PM    ( Status: F )            TSH  3.170          Assessment/Plan 1. Diabetes mellitus type 2 in nonobese Fasting blood sugars below 150; will cont current medications and follow A1c   2. Hypertension Stable will cont current medication  3. UTI (lower urinary tract infection) resolved  4. Generalized OA Reports frequent pain; tylenol is helping pain will schedule at this time.  - acetaminophen (TYLENOL 8 HOUR) 650 MG CR tablet; Will schedule every 12 hours but may have additional dose if needed  Dispense: 30 tablet; Refill: 0  5. Memory loss Will increase aricept to 10 mg at this time - donepezil (ARICEPT) 10 MG tablet; Take 1 tablet (10 mg total) by mouth at bedtime.  Dispense: 30 tablet; Refill: 0

## 2013-09-03 ENCOUNTER — Non-Acute Institutional Stay (SKILLED_NURSING_FACILITY): Payer: Medicare Other | Admitting: Nurse Practitioner

## 2013-09-03 DIAGNOSIS — M159 Polyosteoarthritis, unspecified: Secondary | ICD-10-CM

## 2013-09-03 DIAGNOSIS — R413 Other amnesia: Secondary | ICD-10-CM

## 2013-09-03 DIAGNOSIS — I10 Essential (primary) hypertension: Secondary | ICD-10-CM

## 2013-09-03 DIAGNOSIS — E119 Type 2 diabetes mellitus without complications: Secondary | ICD-10-CM

## 2013-09-03 NOTE — Progress Notes (Signed)
Patient ID: Jerry Stanley, male   DOB: [birth year], 72 y.o.   MRN: [id number]  Nursing Home Location:  [facility]   Place of Service: SNF (31)  Chief Complaint  Patient presents with  . Medical Managment of Chronic Issues    HPI:  72 year old man with PMH of stroke, DM II, HTN, history of stroke is being seen today for routine follow up.  Pt is doing well in the current setting and nursing without concerns  Hypertension - blood pressures have been elevated; norvac was stopped due to LE edema and he is currently on Diovan 160 mg only History of stroke - Aspirin to 81 mg, Plavix, and Lipitor 40 mg  Hyperlipemia - on statin- no c/o of myalgias  Diabetes mellitus type 2- . stabe at this time on metformin and onglyza  Memory loss- ongoing; pt on aricept 10 mg daily  OA- pt tolerating tylenol taking twice daily scheduled and PRN pain reports he is still with freq pain but medication helps    Review of Systems:  DATA OBTAINED: from patient, nurse, medical record, family member GENERAL: Feels well no fevers, fatigue, appetite changes SKIN: No itching, rash or wounds EYES: No eye pain, redness, discharge EARS: No earache, tinnitus, change in hearing NOSE: No congestion, drainage or bleeding  MOUTH/THROAT: No mouth or tooth pain, No sore throat, No difficulty chewing or swallowing  RESPIRATORY: No cough, wheezing, SOB CARDIAC: No chest pain, palpitations, lower extremity edema  GI: No abdominal pain, No N/V/D or constipation, No heartburn or reflux  GU: No dysuria, frequency or urgency, or incontinence  MUSCULOSKELETAL: No unrelieved bone/joint pain- freq knee pain but tylenol relieves pain  NEUROLOGIC: Awake, alert, appropriate to situation, No change in mental status. Moves all four, no focal deficits PSYCHIATRIC: No overt anxiety or sadness. Sleeps well. No behavior issue.  AMBULATION:  [facility]   Medications: Patient's Medications  New Prescriptions   No medications on  file  Previous Medications   ACETAMINOPHEN (TYLENOL 8 HOUR) 650 MG CR TABLET    Will schedule every 12 hours but may have additional dose if needed   ALBUTEROL (PROVENTIL HFA;VENTOLIN HFA) 108 (90 BASE) MCG/ACT INHALER    Inhale 2 puffs into the lungs every 6 (six) hours as needed for wheezing.   ASPIRIN EC 81 MG EC TABLET    Take 1 tablet (81 mg total) by mouth daily.   ATORVASTATIN (LIPITOR) 40 MG TABLET    Take 40 mg by mouth daily.   CLOPIDOGREL (PLAVIX) 75 MG TABLET    Take 75 mg by mouth daily.   DONEPEZIL (ARICEPT) 10 MG TABLET    Take 1 tablet (10 mg total) by mouth at bedtime.   METFORMIN (GLUCOPHAGE) 500 MG TABLET    Take 500 mg by mouth 2 (two) times daily with a meal.   SAXAGLIPTIN HCL (ONGLYZA) 2.5 MG TABS TABLET    Take 2.5 mg by mouth daily.   TAMSULOSIN (FLOMAX) 0.4 MG CAPS    Take by mouth.   VALSARTAN (DIOVAN) 160 MG TABLET    Take 160 mg by mouth daily.  Modified Medications   No medications on file  Discontinued Medications   AMLODIPINE (NORVASC) 10 MG TABLET    Take 10 mg by mouth daily.     Physical Exam:  Filed Vitals:   09/03/13 1458  BP: 149/96  Pulse: 97  Temp: 98.4 F (36.9 C)  Resp: 20  Weight: 161 lb 3.2 oz (73.12 kg)  GENERAL APPEARANCE: Alert, conversant. Appropriately groomed. No acute distress.   SKIN: No diaphoresis rash, or wounds HEENT: unremarkable  NECK: No thyroid tenderness, enlargement or nodule   RESPIRATORY: Breathing is even, unlabored. Lung sounds are clear    CARDIOVASCULAR: Heart RRR no murmurs, rubs or gallops. No peripheral edema.   ARTERIAL: radial pulse 2+   VENOUS: No varicosities. No venous stasis skin changes   GASTROINTESTINAL: Abdomen is soft, non-tender, not distended w/ normal bowel sounds. GENITOURINARY: Bladder non tender, not distended   MUSCULOSKELETAL: No abnormal joints or musculature NEUROLOGIC: Oriented to self . Cranial nerves 2-12 grossly intact. Moves all extremities no tremor. PSYCHIATRIC: Mood and affect  appropriate to situation, no behavioral issues   Labs reviewed/Significant Diagnostic Results: Basic Metabolic Panel       Result: 06/25/2013 2:44 PM    ( Status: F )       C     Sodium  137        135-145  mEq/L  SLN       Potassium  3.5        3.5-5.3  mEq/L  SLN       Chloride  103        96-112  mEq/L  SLN       CO2  26        19-32  mEq/L  SLN       Glucose  176     H  70-99  mg/dL  SLN       BUN  15        6-23  mg/dL  SLN       Creatinine  0.79        0.50-1.35  mg/dL  SLN       Calcium  8.8        8.4-10.5  mg/dL  SLN      Lipid Profile       Result: 06/25/2013 2:44 PM    ( Status: F )            Cholesterol  95        0-200  mg/dL  SLN  C     Triglyceride  96        <150  mg/dL  SLN       HDL Cholesterol  28     L  >39  mg/dL  SLN       Total Chol/HDL Ratio  3.4         Ratio  SLN       VLDL Cholesterol (Calc)  19        0-40  mg/dL  SLN       LDL Cholesterol (Calc)  48        0-99  mg/dL  SLN  C    Hemoglobin W2N       Result: 06/25/2013 3:53 PM    ( Status: F )            Hemoglobin A1C  7.3     H  <5.7  %  SLN  C     Estimated Average Glucose  163     H  <117  mg/dL  SLN      Lipid Profile  Result: 07/25/2013 11:16 PM ( Status: F ) C  Cholesterol 97 0-200 mg/dL SLN C  Triglyceride 562 <150 mg/dL SLN  HDL Cholesterol 30 L >39 mg/dL SLN  Total Chol/HDL Ratio 3.2 Ratio SLN  VLDL Cholesterol (Calc)  29 0-40 mg/dL SLN  LDL Cholesterol (Calc) 38 1-61 mg/dL SLN C  Liver Profile  Result: 07/25/2013 11:16 PM ( Status: F )  Bilirubin, Total 0.4 0.3-1.2 mg/dL SLN  Bilirubin, Direct 0.1 0.0-0.3 mg/dL SLN  Indirect Bilirubin 0.3 0.0-0.9 mg/dL SLN  Alkaline Phosphatase 82 39-117 U/L SLN  AST/SGOT 48 H 0-37 U/L SLN  ALT/SGPT 60 H 0-53 U/L SLN  Total Protein 6.9 6.0-8.3 g/dL SLN  Albumin 3.2 L 0.9-6.0 g/dL SLN  Hemoglobin A5W  Result: 07/25/2013 11:27 PM ( Status: F )  Hemoglobin A1C 7.2 H <5.7 % SLN C  Estimated Average Glucose 160 H <117 mg/dL SLN  CBC with Diff  Result: 07/09/2013  2:17 PM ( Status: F ) C  WBC 5.4 4.0-10.5 K/uL SLN  RBC 4.31 4.22-5.81 MIL/uL SLN  Hemoglobin 13.1 13.0-17.0 g/dL SLN  Hematocrit 09.8 L 39.0-52.0 % SLN  MCV 86.8 78.0-100.0 fL SLN  MCH 30.4 26.0-34.0 pg SLN  MCHC 35.0 30.0-36.0 g/dL SLN  RDW 11.9 14.7-82.9 % SLN  Platelet Count 150 150-400 K/uL SLN  Granulocyte % 29 L 43-77 % SLN  Absolute Gran 1.6 L 1.7-7.7 K/uL SLN  Lymph % 61 H 12-46 % SLN  Absolute Lymph 3.3 0.7-4.0 K/uL SLN  Mono % 9 3-12 % SLN  Absolute Mono 0.5 0.1-1.0 K/uL SLN  Eos % 1 0-5 % SLN  Absolute Eos 0.1 0.0-0.7 K/uL SLN  Baso % 0 0-1 % SLN  Absolute Baso 0.0 0.0-0.1 K/uL SLN  Smear Review Criteria for review not met SLN  Comprehensive Metabolic Panel  Result: 07/09/2013 3:41 PM ( Status: F )  Sodium 138 135-145 mEq/L SLN  Potassium 3.8 3.5-5.3 mEq/L SLN  Chloride 106 96-112 mEq/L SLN  CO2 24 19-32 mEq/L SLN  Glucose 132 H 70-99 mg/dL SLN  BUN 11 5-62 mg/dL SLN  Creatinine 1.30 8.65-7.84 mg/dL SLN  Bilirubin, Total 0.3 0.3-1.2 mg/dL SLN  Alkaline Phosphatase 78 39-117 U/L SLN  AST/SGOT 62 H 0-37 U/L SLN  ALT/SGPT 91 H 0-53 U/L SLN  Total Protein 7.1 6.0-8.3 g/dL SLN  Albumin 3.4 L 6.9-6.2 g/dL SLN  Calcium 8.8 9.5-28.4 mg/dL SLN  TSH, Ultrasensitive  Result: 07/09/2013 3:34 PM ( Status: F )  TSH 3.170     Assessment/Plan 1. Memory loss Stable doing well in current setting  2. Generalized OA Stable on medications  3. Diabetes mellitus type 2 in nonobese Stable; Will cont current medication; A1c 7.2  4. Hypertension Worsening high blood pressure; previously on Norvasc but this stopped due to edema will increase valsartan to 320 and follow up bmp in 2 weeks

## 2013-09-23 ENCOUNTER — Non-Acute Institutional Stay (SKILLED_NURSING_FACILITY): Payer: Medicare Other | Admitting: Nurse Practitioner

## 2013-09-23 DIAGNOSIS — E785 Hyperlipidemia, unspecified: Secondary | ICD-10-CM | POA: Insufficient documentation

## 2013-09-23 DIAGNOSIS — E119 Type 2 diabetes mellitus without complications: Secondary | ICD-10-CM

## 2013-09-23 DIAGNOSIS — I10 Essential (primary) hypertension: Secondary | ICD-10-CM

## 2013-09-23 DIAGNOSIS — M159 Polyosteoarthritis, unspecified: Secondary | ICD-10-CM

## 2013-09-23 NOTE — Progress Notes (Signed)
Patient ID: Jerry Stanley, male   DOB: 24-May-1941, 72 y.o.   MRN: [id number]   No Known Allergies  Chief Complaint  Patient presents with  . Medical Managment of Chronic Issues    HPI:  72 year old man with PMH of stroke, DM II, HTN, history of stroke is being seen today for routine follow up.  Pt is doing well in the current setting and nursing without concerns  Hypertension - blood pressures have improved with increase in Diovan to 320 History of stroke - Aspirin to 81 mg, Plavix, and Lipitor 40 mg  Hyperlipemia - on statin- no c/o of myalgias  Diabetes mellitus type 2- . stabe at this time on metformin and onglyza  Memory loss- ongoing; pt on aricept 10 mg daily  OA- pt tolerating tylenol taking twice daily scheduled and PRN pain reports he is still with freq pain but medication helps  Review of Systems:  Review of Systems  Constitutional: Negative for fever, chills, weight loss and malaise/fatigue.  Respiratory: Negative for cough and shortness of breath.   Cardiovascular: Negative for chest pain and leg swelling.  Gastrointestinal: Negative for heartburn, abdominal pain, diarrhea, constipation and blood in stool.  Genitourinary: Negative for dysuria, urgency and frequency.  Musculoskeletal: Positive for joint pain (bilateral knees; left worse than right). Negative for myalgias.  Skin: Negative.   Neurological: Negative for dizziness, weakness and headaches.  Psychiatric/Behavioral: Positive for memory loss. Negative for depression. The patient does not have insomnia.      Past Medical History  Diagnosis Date  . Stroke 02/2013  . Diabetes mellitus without complication   . Hypertension   . Dementia   . Asthma   . 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 History of prostate cancer     in Remission   . Chronic back pain    No past surgical history on file. Social History:   reports that he has never smoked. He does not have any smokeless tobacco history on  file. He reports that he does not drink alcohol or use illicit drugs.  No family history on file.  Medications: Patient's Medications  New Prescriptions   No medications on file  Previous Medications   ACETAMINOPHEN (TYLENOL 8 HOUR) 650 MG CR TABLET    Will schedule every 12 hours but may have additional dose if needed   ALBUTEROL (PROVENTIL HFA;VENTOLIN HFA) 108 (90 BASE) MCG/ACT INHALER    Inhale 2 puffs into the lungs every 6 (six) hours as needed for wheezing.   ASPIRIN EC 81 MG EC TABLET    Take 1 tablet (81 mg total) by mouth daily.   ATORVASTATIN (LIPITOR) 40 MG TABLET    Take 20 mg by mouth daily.    CLOPIDOGREL (PLAVIX) 75 MG TABLET    Take 75 mg by mouth daily.   DONEPEZIL (ARICEPT) 10 MG TABLET    Take 1 tablet (10 mg total) by mouth at bedtime.   METFORMIN (GLUCOPHAGE) 500 MG TABLET    Take 500 mg by mouth 2 (two) times daily with a meal.   SAXAGLIPTIN HCL (ONGLYZA) 2.5 MG TABS TABLET    Take 2.5 mg by mouth daily.   TAMSULOSIN (FLOMAX) 0.4 MG CAPS    Take by mouth.   VALSARTAN (DIOVAN) 160 MG TABLET    Take 320 mg by mouth daily.   Modified Medications   No medications on file  Discontinued Medications   No medications on file     Physical Exam:  Filed Vitals:  09/23/13 1214  BP: 120/76  Pulse: 65  Temp: 98 F (36.7 C)  Resp: 20   GENERAL APPEARANCE: Alert, conversant. Appropriately groomed. No acute distress.  SKIN: No diaphoresis rash, or wounds  Mouth: bad dentition noted NECK: No thyroid tenderness, enlargement or nodule  RESPIRATORY: Breathing is even, unlabored. Lung sounds are clear  CARDIOVASCULAR: Heart RRR no murmurs, rubs or gallops. No peripheral edema.  ARTERIAL: radial pulse 2+  GASTROINTESTINAL: Abdomen is soft, non-tender, not distended w/ normal bowel sounds. GENITOURINARY: Bladder non tender, not distended  MUSCULOSKELETAL: No abnormal joints or musculature  NEUROLOGIC: Oriented to self . Cranial nerves 2-12 grossly intact. Moves all  extremities no tremor.  PSYCHIATRIC: no behavioral issues    Labs reviewed: Basic Metabolic Panel:  Recent Labs  46/96/29 1243 04/04/13 0544  NA 139 136  K 3.1* 3.5  CL 102 105  CO2 29 23  GLUCOSE 142* 177*  BUN 14 11  CREATININE 0.73 0.60  CALCIUM 9.2 8.5   Liver Function Tests:  Recent Labs  04/03/13 1243 04/04/13 0544  AST 107* 72*  ALT 91* 70*  ALKPHOS 74 63  BILITOT 0.5 0.5  PROT 8.0 6.8  ALBUMIN 3.1* 2.5*   No results found for this basename: LIPASE, AMYLASE,  in the last 8760 hours No results found for this basename: AMMONIA,  in the last 8760 hours CBC:  Recent Labs  04/03/13 1243 04/04/13 0544  WBC 6.4 5.9  NEUTROABS 3.3  --   HGB 14.4 13.3  HCT 39.8 37.4*  MCV 88.6 88.0  PLT 180 150   Cardiac Enzymes:  Recent Labs  04/03/13 1243  CKTOTAL 136   BNP: No components found with this basename: POCBNP,  CBG:  Recent Labs  04/07/13 2207 04/08/13 0806 04/08/13 1205  GLUCAP 176* 168* 237*  Basic Metabolic Panel  Result: 06/25/2013 2:44 PM ( Status: F ) C  Sodium 137 135-145 mEq/L SLN  Potassium 3.5 3.5-5.3 mEq/L SLN  Chloride 103 96-112 mEq/L SLN  CO2 26 19-32 mEq/L SLN  Glucose 176 H 70-99 mg/dL SLN  BUN 15 5-28 mg/dL SLN  Creatinine 4.13 2.44-0.10 mg/dL SLN  Calcium 8.8 2.7-25.3 mg/dL SLN  Lipid Profile  Result: 06/25/2013 2:44 PM ( Status: F )  Cholesterol 95 0-200 mg/dL SLN C  Triglyceride 96 <150 mg/dL SLN  HDL Cholesterol 28 L >39 mg/dL SLN  Total Chol/HDL Ratio 3.4 Ratio SLN  VLDL Cholesterol (Calc) 19 6-64 mg/dL SLN  LDL Cholesterol (Calc) 48 4-03 mg/dL SLN C  Hemoglobin K7Q  Result: 06/25/2013 3:53 PM ( Status: F )  Hemoglobin A1C 7.3 H <5.7 % SLN C  Estimated Average Glucose 163 H <117 mg/dL SLN  Lipid Profile  Result: 07/25/2013 11:16 PM ( Status: F ) C  Cholesterol 97 0-200 mg/dL SLN C  Triglyceride 259 <150 mg/dL SLN  HDL Cholesterol 30 L >39 mg/dL SLN  Total Chol/HDL Ratio 3.2 Ratio SLN  VLDL Cholesterol (Calc) 29  0-40 mg/dL SLN  LDL Cholesterol (Calc) 38 5-63 mg/dL SLN C  Liver Profile  Result: 07/25/2013 11:16 PM ( Status: F )  Bilirubin, Total 0.4 0.3-1.2 mg/dL SLN  Bilirubin, Direct 0.1 0.0-0.3 mg/dL SLN  Indirect Bilirubin 0.3 0.0-0.9 mg/dL SLN  Alkaline Phosphatase 82 39-117 U/L SLN  AST/SGOT 48 H 0-37 U/L SLN  ALT/SGPT 60 H 0-53 U/L SLN  Total Protein 6.9 6.0-8.3 g/dL SLN  Albumin 3.2 L 8.7-5.6 g/dL SLN  Hemoglobin E3P  Result: 07/25/2013 11:27 PM ( Status: F )  Hemoglobin A1C 7.2 H <5.7 % SLN C  Estimated Average Glucose 160 H <117 mg/dL SLN  CBC with Diff  Result: 07/09/2013 2:17 PM ( Status: F ) C  WBC 5.4 4.0-10.5 K/uL SLN  RBC 4.31 4.22-5.81 MIL/uL SLN  Hemoglobin 13.1 13.0-17.0 g/dL SLN  Hematocrit 16.1 L 39.0-52.0 % SLN  MCV 86.8 78.0-100.0 fL SLN  MCH 30.4 26.0-34.0 pg SLN  MCHC 35.0 30.0-36.0 g/dL SLN  RDW 09.6 04.5-40.9 % SLN  Platelet Count 150 150-400 K/uL SLN  Granulocyte % 29 L 43-77 % SLN  Absolute Gran 1.6 L 1.7-7.7 K/uL SLN  Lymph % 61 H 12-46 % SLN  Absolute Lymph 3.3 0.7-4.0 K/uL SLN  Mono % 9 3-12 % SLN  Absolute Mono 0.5 0.1-1.0 K/uL SLN  Eos % 1 0-5 % SLN  Absolute Eos 0.1 0.0-0.7 K/uL SLN  Baso % 0 0-1 % SLN  Absolute Baso 0.0 0.0-0.1 K/uL SLN  Smear Review Criteria for review not met SLN  Comprehensive Metabolic Panel  Result: 07/09/2013 3:41 PM ( Status: F )  Sodium 138 135-145 mEq/L SLN  Potassium 3.8 3.5-5.3 mEq/L SLN  Chloride 106 96-112 mEq/L SLN  CO2 24 19-32 mEq/L SLN  Glucose 132 H 70-99 mg/dL SLN  BUN 11 8-11 mg/dL SLN  Creatinine 9.14 7.82-9.56 mg/dL SLN  Bilirubin, Total 0.3 0.3-1.2 mg/dL SLN  Alkaline Phosphatase 78 39-117 U/L SLN  AST/SGOT 62 H 0-37 U/L SLN  ALT/SGPT 91 H 0-53 U/L SLN  Total Protein 7.1 6.0-8.3 g/dL SLN  Albumin 3.4 L 2.1-3.0 g/dL SLN  Calcium 8.8 8.6-57.8 mg/dL SLN  TSH, Ultrasensitive  Result: 07/09/2013 3:34 PM ( Status: F )  TSH 3.170     Basic Metabolic Panel    Result: 09/17/2013 3:07 PM   ( Status: F )        Sodium 137     135-145 mEq/L SLN   Potassium 4.1     3.5-5.3 mEq/L SLN   Chloride 104     96-112 mEq/L SLN   CO2 24     19-32 mEq/L SLN   Glucose 95     70-99 mg/dL SLN   BUN 15     4-69 mg/dL SLN   Creatinine 6.29     0.50-1.35 mg/dL SLN   Calcium 8.8    Lipid Profile    Result: 08/26/2013 4:06 PM   ( Status: F )     C Cholesterol 114     0-200 mg/dL SLN C Triglyceride 92     <150 mg/dL SLN   HDL Cholesterol 38   L >39 mg/dL SLN   Total Chol/HDL Ratio 3.0      Ratio SLN   VLDL Cholesterol (Calc) 18     0-40 mg/dL SLN   LDL Cholesterol (Calc) 58     0-99 mg/dL SLN C Liver Profile    Result: 08/26/2013 4:06 PM   ( Status: F )       Bilirubin, Total 0.4     0.3-1.2 mg/dL SLN   Bilirubin, Direct 0.1     0.0-0.3 mg/dL SLN   Indirect Bilirubin 0.3     0.0-0.9 mg/dL SLN   Alkaline Phosphatase 84     39-117 U/L SLN   AST/SGOT 46   H 0-37 U/L SLN   ALT/SGPT 66   H 0-53 U/L SLN   Total Protein 7.0     6.0-8.3 g/dL SLN   Albumin 3.4   L 5.2-8.4 g/dL SLN   Hemoglobin X3K  Result: 08/26/2013 4:18 PM   ( Status: F )       Hemoglobin A1C 6.7   H <5.7 % SLN C Estimated Average Glucose 146   H <117 mg/dL SLN      Assessment/Plan 1. Generalized OA Stable on current medicaitons  2. Diabetes mellitus type 2 in nonobese Stable will cont current regimen and cont to follow A1c  3. Hypertension Improved with recent changes in medications  4. Other and unspecified hyperlipidemia Stable on current medications will cont Lipitor at 20 mg daily and recheck fasting lipids in 3 months   Will also have staff place pt on dental list

## 2013-10-16 ENCOUNTER — Non-Acute Institutional Stay (SKILLED_NURSING_FACILITY): Payer: Medicare Other | Admitting: Nurse Practitioner

## 2013-10-16 DIAGNOSIS — B351 Tinea unguium: Secondary | ICD-10-CM

## 2013-10-16 NOTE — Progress Notes (Signed)
Patient ID: Jerry Stanley, male   DOB: [birth year], 72 y.o.   MRN: [id number] Nursing Home Location:  [facility]   Place of Service: SNF (31)  PCP: Default, Provider, MD  No Known Allergies  Chief Complaint  Patient presents with  . Acute Visit    HPI:  72 year old male with PMH of stroke, DM II, HTN  being seen today at the request of staff due to dark cracked fingernails on both hands; pt reports this does not bother him, sometimes itchy. No drainage or swelling noted   Review of Systems:  Review of Systems  Constitutional: Negative for fever, chills and malaise/fatigue.  Respiratory: Negative for shortness of breath.   Cardiovascular: Negative for chest pain.  Gastrointestinal: Negative for abdominal pain.  Skin: Positive for itching (at fingernails). Negative for rash.     Past Medical History  Diagnosis Date  . Stroke 02/2013  . Diabetes mellitus without complication   . Hypertension   . Dementia   . Asthma   . 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 History of prostate cancer     in Remission   . Chronic back pain    No past surgical history on file. Social History:   reports that he has never smoked. He does not have any smokeless tobacco history on file. He reports that he does not drink alcohol or use illicit drugs.  No family history on file.  Medications: Patient's Medications  New Prescriptions   No medications on file  Previous Medications   ACETAMINOPHEN (TYLENOL 8 HOUR) 650 MG CR TABLET    Will schedule every 12 hours but may have additional dose if needed   ALBUTEROL (PROVENTIL HFA;VENTOLIN HFA) 108 (90 BASE) MCG/ACT INHALER    Inhale 2 puffs into the lungs every 6 (six) hours as needed for wheezing.   ASPIRIN EC 81 MG EC TABLET    Take 1 tablet (81 mg total) by mouth daily.   ATORVASTATIN (LIPITOR) 40 MG TABLET    Take 20 mg by mouth daily.    CLOPIDOGREL (PLAVIX) 75 MG TABLET    Take 75 mg by mouth daily.   DONEPEZIL  (ARICEPT) 10 MG TABLET    Take 1 tablet (10 mg total) by mouth at bedtime.   METFORMIN (GLUCOPHAGE) 500 MG TABLET    Take 500 mg by mouth 2 (two) times daily with a meal.   SAXAGLIPTIN HCL (ONGLYZA) 2.5 MG TABS TABLET    Take 2.5 mg by mouth daily.   TAMSULOSIN (FLOMAX) 0.4 MG CAPS    Take by mouth.   VALSARTAN (DIOVAN) 160 MG TABLET    Take 320 mg by mouth daily.   Modified Medications   No medications on file  Discontinued Medications   No medications on file     Physical Exam:  Filed Vitals:   10/16/13 1137  BP: 127/65  Pulse: 76  Temp: 98 F (36.7 C)  Resp: 20   Physical Exam  Constitutional: He is well-developed, well-nourished, and in no distress. No distress.  Cardiovascular: Normal rate, regular rhythm and normal heart sounds.   Pulmonary/Chest: Effort normal and breath sounds normal.  Abdominal: Soft. Bowel sounds are normal.  Skin: Skin is warm and dry. He is not diaphoretic. No erythema.  Dark overgrown brittle fingernails on all fingers       Labs reviewed: Basic Metabolic Panel:  Recent Labs  44/01/02 1243 04/04/13 0544  NA 139 136  K 3.1* 3.5  CL 102  105  CO2 29 23  GLUCOSE 142* 177*  BUN 14 11  CREATININE 0.73 0.60  CALCIUM 9.2 8.5   Liver Function Tests:  Recent Labs  04/03/13 1243 04/04/13 0544  AST 107* 72*  ALT 91* 70*  ALKPHOS 74 63  BILITOT 0.5 0.5  PROT 8.0 6.8  ALBUMIN 3.1* 2.5*   No results found for this basename: LIPASE, AMYLASE,  in the last 8760 hours No results found for this basename: AMMONIA,  in the last 8760 hours CBC:  Recent Labs  04/03/13 1243 04/04/13 0544  WBC 6.4 5.9  NEUTROABS 3.3  --   HGB 14.4 13.3  HCT 39.8 37.4*  MCV 88.6 88.0  PLT 180 150   Cardiac Enzymes:  Recent Labs  04/03/13 1243  CKTOTAL 136   BNP: No components found with this basename: POCBNP,  CBG:  Recent Labs  04/07/13 2207 04/08/13 0806 04/08/13 1205  GLUCAP 176* 168* 237*   TSH: No results found for this basename:  TSH,  in the last 8760 hours A1C: Lab Results  Component Value Date   HGBA1C 7.7* 04/04/2013    Assessment/Plan   1. Onychomycosis -due to the age and co-morbdities of the pt will not prescribe any PO medications due to side effects -Nails do not bother pt or effect his ADLs -Will have staff cut down overgrowth and keep clean and dry

## 2013-10-24 ENCOUNTER — Non-Acute Institutional Stay (SKILLED_NURSING_FACILITY): Payer: Medicare Other | Admitting: Nurse Practitioner

## 2013-10-24 DIAGNOSIS — R413 Other amnesia: Secondary | ICD-10-CM

## 2013-10-24 DIAGNOSIS — I10 Essential (primary) hypertension: Secondary | ICD-10-CM

## 2013-10-24 DIAGNOSIS — M159 Polyosteoarthritis, unspecified: Secondary | ICD-10-CM

## 2013-10-24 DIAGNOSIS — E785 Hyperlipidemia, unspecified: Secondary | ICD-10-CM

## 2013-10-24 DIAGNOSIS — E119 Type 2 diabetes mellitus without complications: Secondary | ICD-10-CM

## 2013-10-24 NOTE — Progress Notes (Signed)
Patient ID: Jerry Stanley, male   DOB: [birth year], 72 y.o.   MRN: [id number] Nursing Home Location:  [facility]   Place of Service: SNF (31)   No Known Allergies  Chief Complaint  Patient presents with  . Medical Managment of Chronic Issues    HPI:  72 year old man with PMH of stroke, DM II, HTN, history of stroke is being seen today for routine follow up.  Pt is doing well in the current setting and nursing without concerns  Hypertension - stable on valsartan 320 mg daily  History of stroke - Aspirin to 81 mg, Plavix, and Lipitor 40 mg  Hyperlipemia - on statin- no c/o of myalgias  Diabetes mellitus type 2- . stabe at this time on metformin and onglyza  Memory loss- ongoing; pt on aricept 10 mg daily  OA- bilateral knees; pt tolerating tylenol taking twice daily scheduled and PRN pain   Review of Systems:  Review of Systems  Constitutional: Negative for fever, chills, weight loss and malaise/fatigue.  Respiratory: Negative for cough and shortness of breath.   Cardiovascular: Negative for chest pain and leg swelling.  Gastrointestinal: Negative for heartburn, abdominal pain, diarrhea, constipation and blood in stool.  Genitourinary: Negative for dysuria, urgency and frequency.  Musculoskeletal: Positive for joint pain (bilateral knees; left worse than right). Negative for myalgias.  Skin: Negative.   Neurological: Negative for dizziness, weakness and headaches.  Psychiatric/Behavioral: Positive for memory loss. Negative for depression. The patient does not have insomnia.      Past Medical History  Diagnosis Date  . Stroke 02/2013  . Diabetes mellitus without complication   . Hypertension   . Dementia   . Asthma   . 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 History of prostate cancer     in Remission   . Chronic back pain    No past surgical history on file. Social History:   reports that he has never smoked. He does not have any smokeless  tobacco history on file. He reports that he does not drink alcohol or use illicit drugs.  No family history on file.  Medications: Patient's Medications  New Prescriptions   No medications on file  Previous Medications   ACETAMINOPHEN (TYLENOL 8 HOUR) 650 MG CR TABLET    Will schedule every 12 hours but may have additional dose if needed   ALBUTEROL (PROVENTIL HFA;VENTOLIN HFA) 108 (90 BASE) MCG/ACT INHALER    Inhale 2 puffs into the lungs every 6 (six) hours as needed for wheezing.   ASPIRIN EC 81 MG EC TABLET    Take 1 tablet (81 mg total) by mouth daily.   ATORVASTATIN (LIPITOR) 40 MG TABLET    Take 20 mg by mouth daily.    CLOPIDOGREL (PLAVIX) 75 MG TABLET    Take 75 mg by mouth daily.   DONEPEZIL (ARICEPT) 10 MG TABLET    Take 1 tablet (10 mg total) by mouth at bedtime.   METFORMIN (GLUCOPHAGE) 500 MG TABLET    Take 500 mg by mouth 2 (two) times daily with a meal.   SAXAGLIPTIN HCL (ONGLYZA) 2.5 MG TABS TABLET    Take 2.5 mg by mouth daily.   TAMSULOSIN (FLOMAX) 0.4 MG CAPS    Take by mouth.   VALSARTAN (DIOVAN) 160 MG TABLET    Take 320 mg by mouth daily.   Modified Medications   No medications on file  Discontinued Medications   No medications on file  Physical Exam:  Filed Vitals:   10/24/13 1445  BP: 140/79  Pulse: 83  Temp: 96.6 F (35.9 C)  Resp: 20    Physical Exam  Constitutional: He is well-developed, well-nourished, and in no distress. No distress.  HENT:  Head: Normocephalic and atraumatic.  Eyes: Conjunctivae and EOM are normal. Pupils are equal, round, and reactive to light.  Neck: Normal range of motion. Neck supple. No thyromegaly present.  Cardiovascular: Normal rate, regular rhythm and normal heart sounds.   Pulmonary/Chest: Effort normal and breath sounds normal. No respiratory distress.  Abdominal: Soft. Bowel sounds are normal. He exhibits no distension. There is no tenderness.  Musculoskeletal: Normal range of motion. He exhibits tenderness  (to bilateral knees). He exhibits no edema.  Self propels in wc  Neurological: He is alert.  Skin: Skin is warm and dry. He is not diaphoretic.     Labs reviewed: Basic Metabolic Panel:  Recent Labs  16/10/96 1243 04/04/13 0544  NA 139 136  K 3.1* 3.5  CL 102 105  CO2 29 23  GLUCOSE 142* 177*  BUN 14 11  CREATININE 0.73 0.60  CALCIUM 9.2 8.5   Liver Function Tests:  Recent Labs  04/03/13 1243 04/04/13 0544  AST 107* 72*  ALT 91* 70*  ALKPHOS 74 63  BILITOT 0.5 0.5  PROT 8.0 6.8  ALBUMIN 3.1* 2.5*   No results found for this basename: LIPASE, AMYLASE,  in the last 8760 hours No results found for this basename: AMMONIA,  in the last 8760 hours CBC:  Recent Labs  04/03/13 1243 04/04/13 0544  WBC 6.4 5.9  NEUTROABS 3.3  --   HGB 14.4 13.3  HCT 39.8 37.4*  MCV 88.6 88.0  PLT 180 150   Cardiac Enzymes:  Recent Labs  04/03/13 1243  CKTOTAL 136   BNP: No components found with this basename: POCBNP,  CBG:  Recent Labs  04/07/13 2207 04/08/13 0806 04/08/13 1205  GLUCAP 176* 168* 237*   TSH: No results found for this basename: TSH,  in the last 8760 hours A1C: Lab Results  Component Value Date   HGBA1C 7.7* 04/04/2013   Basic Metabolic Panel  Result: 06/25/2013 2:44 PM ( Status: F ) C  Sodium 137 135-145 mEq/L SLN  Potassium 3.5 3.5-5.3 mEq/L SLN  Chloride 103 96-112 mEq/L SLN  CO2 26 19-32 mEq/L SLN  Glucose 176 H 70-99 mg/dL SLN  BUN 15 0-45 mg/dL SLN  Creatinine 4.09 8.11-9.14 mg/dL SLN  Calcium 8.8 7.8-29.5 mg/dL SLN  Lipid Profile  Result: 06/25/2013 2:44 PM ( Status: F )  Cholesterol 95 0-200 mg/dL SLN C  Triglyceride 96 <150 mg/dL SLN  HDL Cholesterol 28 L >39 mg/dL SLN  Total Chol/HDL Ratio 3.4 Ratio SLN  VLDL Cholesterol (Calc) 19 6-21 mg/dL SLN  LDL Cholesterol (Calc) 48 3-08 mg/dL SLN C  Hemoglobin M5H  Result: 06/25/2013 3:53 PM ( Status: F )  Hemoglobin A1C 7.3 H <5.7 % SLN C  Estimated Average Glucose 163 H <117 mg/dL SLN   Lipid Profile  Result: 07/25/2013 11:16 PM ( Status: F ) C  Cholesterol 97 0-200 mg/dL SLN C  Triglyceride 846 <150 mg/dL SLN  HDL Cholesterol 30 L >39 mg/dL SLN  Total Chol/HDL Ratio 3.2 Ratio SLN  VLDL Cholesterol (Calc) 29 9-62 mg/dL SLN  LDL Cholesterol (Calc) 38 9-52 mg/dL SLN C  Liver Profile  Result: 07/25/2013 11:16 PM ( Status: F )  Bilirubin, Total 0.4 0.3-1.2 mg/dL SLN  Bilirubin, Direct 0.1  0.0-0.3 mg/dL SLN  Indirect Bilirubin 0.3 0.0-0.9 mg/dL SLN  Alkaline Phosphatase 82 39-117 U/L SLN  AST/SGOT 48 H 0-37 U/L SLN  ALT/SGPT 60 H 0-53 U/L SLN  Total Protein 6.9 6.0-8.3 g/dL SLN  Albumin 3.2 L 4.0-9.8 g/dL SLN  Hemoglobin J1B  Result: 07/25/2013 11:27 PM ( Status: F )  Hemoglobin A1C 7.2 H <5.7 % SLN C  Estimated Average Glucose 160 H <117 mg/dL SLN  CBC with Diff  Result: 07/09/2013 2:17 PM ( Status: F ) C  WBC 5.4 4.0-10.5 K/uL SLN  RBC 4.31 4.22-5.81 MIL/uL SLN  Hemoglobin 13.1 13.0-17.0 g/dL SLN  Hematocrit 14.7 L 39.0-52.0 % SLN  MCV 86.8 78.0-100.0 fL SLN  MCH 30.4 26.0-34.0 pg SLN  MCHC 35.0 30.0-36.0 g/dL SLN  RDW 82.9 56.2-13.0 % SLN  Platelet Count 150 150-400 K/uL SLN  Granulocyte % 29 L 43-77 % SLN  Absolute Gran 1.6 L 1.7-7.7 K/uL SLN  Lymph % 61 H 12-46 % SLN  Absolute Lymph 3.3 0.7-4.0 K/uL SLN  Mono % 9 3-12 % SLN  Absolute Mono 0.5 0.1-1.0 K/uL SLN  Eos % 1 0-5 % SLN  Absolute Eos 0.1 0.0-0.7 K/uL SLN  Baso % 0 0-1 % SLN  Absolute Baso 0.0 0.0-0.1 K/uL SLN  Smear Review Criteria for review not met SLN  Comprehensive Metabolic Panel  Result: 07/09/2013 3:41 PM ( Status: F )  Sodium 138 135-145 mEq/L SLN  Potassium 3.8 3.5-5.3 mEq/L SLN  Chloride 106 96-112 mEq/L SLN  CO2 24 19-32 mEq/L SLN  Glucose 132 H 70-99 mg/dL SLN  BUN 11 8-65 mg/dL SLN  Creatinine 7.84 6.96-2.95 mg/dL SLN  Bilirubin, Total 0.3 0.3-1.2 mg/dL SLN  Alkaline Phosphatase 78 39-117 U/L SLN  AST/SGOT 62 H 0-37 U/L SLN  ALT/SGPT 91 H 0-53 U/L SLN  Total Protein 7.1 6.0-8.3  g/dL SLN  Albumin 3.4 L 2.8-4.1 g/dL SLN  Calcium 8.8 3.2-44.0 mg/dL SLN  TSH, Ultrasensitive  Result: 07/09/2013 3:34 PM ( Status: F )  TSH 3.170    Basic Metabolic Panel  Result: 09/17/2013 3:07 PM ( Status: F )  Sodium 137 135-145 mEq/L SLN  Potassium 4.1 3.5-5.3 mEq/L SLN  Chloride 104 96-112 mEq/L SLN  CO2 24 19-32 mEq/L SLN  Glucose 95 70-99 mg/dL SLN  BUN 15 1-02 mg/dL SLN  Creatinine 7.25 3.66-4.40 mg/dL SLN  Calcium 8.8  Lipid Profile  Result: 08/26/2013 4:06 PM ( Status: F ) C  Cholesterol 114 0-200 mg/dL SLN C  Triglyceride 92 <150 mg/dL SLN  HDL Cholesterol 38 L >39 mg/dL SLN  Total Chol/HDL Ratio 3.0 Ratio SLN  VLDL Cholesterol (Calc) 18 3-47 mg/dL SLN  LDL Cholesterol (Calc) 58 4-25 mg/dL SLN C  Liver Profile  Result: 08/26/2013 4:06 PM ( Status: F )  Bilirubin, Total 0.4 0.3-1.2 mg/dL SLN  Bilirubin, Direct 0.1 0.0-0.3 mg/dL SLN  Indirect Bilirubin 0.3 0.0-0.9 mg/dL SLN  Alkaline Phosphatase 84 39-117 U/L SLN  AST/SGOT 46 H 0-37 U/L SLN  ALT/SGPT 66 H 0-53 U/L SLN  Total Protein 7.0 6.0-8.3 g/dL SLN  Albumin 3.4 L 9.5-6.3 g/dL SLN  Hemoglobin O7F  Result: 08/26/2013 4:18 PM ( Status: F )  Hemoglobin A1C 6.7 H <5.7 % SLN C  Estimated Average Glucose 146 H <117 mg/dL SLN     Assessment/Plan 1. Diabetes mellitus type 2 in nonobese Stable will cont current medications  2. Hypertension Patient is stable; continue current regimen. Will monitor and make changes as necessary.  3. Other and  unspecified hyperlipidemia -LDL at goal on most recent labs -cont lipitor; tolerating medication without side effect  4. 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 Memory loss -unchanged; does well at [facility]; will cont current medications

## 2013-11-18 ENCOUNTER — Non-Acute Institutional Stay (SKILLED_NURSING_FACILITY): Payer: Medicare Other | Admitting: Nurse Practitioner

## 2013-11-18 DIAGNOSIS — E119 Type 2 diabetes mellitus without complications: Secondary | ICD-10-CM

## 2013-11-18 DIAGNOSIS — M159 Polyosteoarthritis, unspecified: Secondary | ICD-10-CM

## 2013-11-18 DIAGNOSIS — R413 Other amnesia: Secondary | ICD-10-CM

## 2013-11-18 DIAGNOSIS — E785 Hyperlipidemia, unspecified: Secondary | ICD-10-CM

## 2013-11-18 DIAGNOSIS — I10 Essential (primary) hypertension: Secondary | ICD-10-CM

## 2013-11-18 NOTE — Progress Notes (Signed)
Patient ID: Jerry Stanley, male   DOB: 08-Feb-1941, 72 y.o.   MRN: [id number]    Nursing Home Location:  [facility]   Place of Service: SNF (31)  No Known Allergies  Chief Complaint  Patient presents with  . Medical Managment of Chronic Issues    HPI:  72 year old man with PMH of stroke, DM II, HTN, history of stroke is being seen today for routine follow up.  Hypertension - stable on valsartan 320 mg daily  History of stroke - Aspirin to 81 mg, Plavix, and Lipitor 40 mg  Hyperlipemia - on statin- no c/o of myalgias  Diabetes mellitus type 2- . stabe at this time on metformin and onglyza  Memory loss- ongoing; pt on aricept 10 mg daily  OA- bilateral knees; pt tolerating tylenol taking twice daily scheduled and PRN pain also has biofreeze to knees as needed Pt does not have any complaints today and nursing without concerns  Review of Systems:  Review of Systems  Constitutional: Negative for weight loss.  Respiratory: Negative for cough and shortness of breath.   Cardiovascular: Negative for chest pain and leg swelling.  Gastrointestinal: Negative for heartburn, abdominal pain, diarrhea and constipation.  Genitourinary: Negative for dysuria.  Musculoskeletal: Positive for joint pain (chronic no changes). Negative for myalgias.  Skin: Negative.   Neurological: Negative for dizziness, weakness and headaches.  Psychiatric/Behavioral: Positive for memory loss. Negative for depression. The patient does not have insomnia.      Past Medical History  Diagnosis Date  . Stroke 02/2013  . Diabetes mellitus without complication   . Hypertension   . Dementia   . Asthma   . 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 History of prostate cancer     in Remission   . Chronic back pain    No past surgical history on file. Social History:   reports that he has never smoked. He does not have any smokeless tobacco history on file. He reports that he does not drink  alcohol or use illicit drugs.  No family history on file.  Medications: Patient's Medications  New Prescriptions   No medications on file  Previous Medications   ACETAMINOPHEN (TYLENOL 8 HOUR) 650 MG CR TABLET    Will schedule every 12 hours but may have additional dose if needed   ALBUTEROL (PROVENTIL HFA;VENTOLIN HFA) 108 (90 BASE) MCG/ACT INHALER    Inhale 2 puffs into the lungs every 6 (six) hours as needed for wheezing.   ASPIRIN EC 81 MG EC TABLET    Take 1 tablet (81 mg total) by mouth daily.   ATORVASTATIN (LIPITOR) 40 MG TABLET    Take 20 mg by mouth daily.    CLOPIDOGREL (PLAVIX) 75 MG TABLET    Take 75 mg by mouth daily.   DONEPEZIL (ARICEPT) 10 MG TABLET    Take 1 tablet (10 mg total) by mouth at bedtime.   METFORMIN (GLUCOPHAGE) 500 MG TABLET    Take 500 mg by mouth 2 (two) times daily with a meal.   SAXAGLIPTIN HCL (ONGLYZA) 2.5 MG TABS TABLET    Take 2.5 mg by mouth daily.   TAMSULOSIN (FLOMAX) 0.4 MG CAPS    Take by mouth.   VALSARTAN (DIOVAN) 160 MG TABLET    Take 320 mg by mouth daily.   Modified Medications   No medications on file  Discontinued Medications   No medications on file     Physical Exam:  Filed Vitals:  11/18/13 1634  BP: 132/66  Pulse: 68  Temp: 97.8 F (36.6 C)  Resp: 20   Physical Exam  Constitutional: He is well-developed, well-nourished, and in no distress. No distress.  HENT: unremarkable Neck: Normal range of motion. Neck supple. No thyromegaly present.  Cardiovascular: Normal rate, regular rhythm and normal heart sounds.  Pulmonary/Chest: Effort normal and breath sounds normal. No respiratory distress.  Abdominal: Soft. Bowel sounds are normal. He exhibits no distension. There is no tenderness.  Musculoskeletal: Normal range of motion. He exhibits tenderness (to bilateral knees). He exhibits no edema.  Self propels in wc  Neurological: He is alert.  Skin: Skin is warm and dry. He is not diaphoretic.     Labs reviewed: Basic  Metabolic Panel:  Recent Labs  78/46/96 1243 04/04/13 0544  NA 139 136  K 3.1* 3.5  CL 102 105  CO2 29 23  GLUCOSE 142* 177*  BUN 14 11  CREATININE 0.73 0.60  CALCIUM 9.2 8.5   Liver Function Tests:  Recent Labs  04/03/13 1243 04/04/13 0544  AST 107* 72*  ALT 91* 70*  ALKPHOS 74 63  BILITOT 0.5 0.5  PROT 8.0 6.8  ALBUMIN 3.1* 2.5*   No results found for this basename: LIPASE, AMYLASE,  in the last 8760 hours No results found for this basename: AMMONIA,  in the last 8760 hours CBC:  Recent Labs  04/03/13 1243 04/04/13 0544  WBC 6.4 5.9  NEUTROABS 3.3  --   HGB 14.4 13.3  HCT 39.8 37.4*  MCV 88.6 88.0  PLT 180 150   Cardiac Enzymes:  Recent Labs  04/03/13 1243  CKTOTAL 136   BNP: No components found with this basename: POCBNP,  CBG:  Recent Labs  04/07/13 2207 04/08/13 0806 04/08/13 1205  GLUCAP 176* 168* 237*   A1C: Lab Results  Component Value Date   HGBA1C 7.7* 04/04/2013   CBC with Diff  Result: 07/09/2013 2:17 PM ( Status: F ) C  WBC 5.4 4.0-10.5 K/uL SLN  RBC 4.31 4.22-5.81 MIL/uL SLN  Hemoglobin 13.1 13.0-17.0 g/dL SLN  Hematocrit 29.5 L 39.0-52.0 % SLN  MCV 86.8 78.0-100.0 fL SLN  MCH 30.4 26.0-34.0 pg SLN  MCHC 35.0 30.0-36.0 g/dL SLN  RDW 28.4 13.2-44.0 % SLN  Platelet Count 150 150-400 K/uL SLN  Granulocyte % 29 L 43-77 % SLN  Absolute Gran 1.6 L 1.7-7.7 K/uL SLN  Lymph % 61 H 12-46 % SLN  Absolute Lymph 3.3 0.7-4.0 K/uL SLN  Mono % 9 3-12 % SLN  Absolute Mono 0.5 0.1-1.0 K/uL SLN  Eos % 1 0-5 % SLN  Absolute Eos 0.1 0.0-0.7 K/uL SLN  Baso % 0 0-1 % SLN  Absolute Baso 0.0 0.0-0.1 K/uL SLN  Smear Review Criteria for review not met SLN  Basic Metabolic Panel    Result: 09/17/2013 3:07 PM   ( Status: F )       Sodium 137     135-145 mEq/L SLN   Potassium 4.1     3.5-5.3 mEq/L SLN   Chloride 104     96-112 mEq/L SLN   CO2 24     19-32 mEq/L SLN   Glucose 95     70-99 mg/dL SLN   BUN 15     1-02 mg/dL SLN    Creatinine 7.25     0.50-1.35 mg/dL SLN   Calcium 8.8 Lipid Profile    Result: 10/26/2013 4:44 PM   ( Status: F )  Cholesterol 106     0-200 mg/dL SLN C Triglyceride 91     <150 mg/dL SLN   HDL Cholesterol 40     >39 mg/dL SLN   Total Chol/HDL Ratio 2.7      Ratio SLN   VLDL Cholesterol (Calc) 18     0-40 mg/dL SLN   LDL Cholesterol (Calc) 48     0-99 mg/dL SLN C Liver Profile    Result: 10/26/2013 4:44 PM   ( Status: F )       Bilirubin, Total 0.4     0.3-1.2 mg/dL SLN   Bilirubin, Direct 0.1     0.0-0.3 mg/dL SLN   Indirect Bilirubin 0.3     0.0-0.9 mg/dL SLN   Alkaline Phosphatase 71     39-117 U/L SLN   AST/SGOT 46   H 0-37 U/L SLN   ALT/SGPT 60   H 0-53 U/L SLN   Total Protein 6.7     6.0-8.3 g/dL SLN   Albumin 3.1   L 9.5-6.2 g/dL SLN   Hemoglobin Z3Y    Result: 10/26/2013 5:51 PM   ( Status: F )       Hemoglobin A1C 6.3   H <5.7 % SLN C Estimated Average Glucose 134      Assessment/Plan 1. Generalized OA -stable on current medications, also has biofreeze to knees as needed  2. Diabetes mellitus type 2 in nonobese -fasting cbgs have been within range; no hypoglycemic episodes  3. Hypertension -blood pressures have been within the target range;will cont to monitor  4. Other and unspecified hyperlipidemia -LDL at gaol   5. Memory loss -doe well in current setting; will cont aricept

## 2013-12-23 ENCOUNTER — Non-Acute Institutional Stay (SKILLED_NURSING_FACILITY): Payer: Medicare Other | Admitting: Nurse Practitioner

## 2013-12-23 ENCOUNTER — Encounter: Payer: Self-pay | Admitting: Nurse Practitioner

## 2013-12-23 DIAGNOSIS — E119 Type 2 diabetes mellitus without complications: Secondary | ICD-10-CM

## 2013-12-23 DIAGNOSIS — I10 Essential (primary) hypertension: Secondary | ICD-10-CM

## 2013-12-23 DIAGNOSIS — R413 Other amnesia: Secondary | ICD-10-CM

## 2013-12-23 DIAGNOSIS — M159 Polyosteoarthritis, unspecified: Secondary | ICD-10-CM

## 2013-12-23 DIAGNOSIS — E785 Hyperlipidemia, unspecified: Secondary | ICD-10-CM

## 2013-12-23 NOTE — Progress Notes (Signed)
Patient ID: Jerry Stanley, male   DOB: 1941/08/11, 73 y.o.   MRN: [id number]    Nursing Home Location:  [facility]   Place of Service: SNF (31)  PCP: Default, Provider, MD  No Known Allergies  Chief Complaint  Patient presents with  . Medical Managment of Chronic Issues    HPI:  73 year old man with PMH of stroke, DM II, HTN, history of stroke is being seen today for routine follow up on chronic conditions .  Hypertension - stable on valsartan 320 mg daily  History of stroke - Aspirin to 81 mg, Plavix, and Lipitor 40 mg  Hyperlipemia - on statin- no c/o of myalgias  Diabetes mellitus type 2- . stabe at this time on metformin and onglyza  Memory loss- ongoing; pt on aricept 10 mg daily  OA- bilateral knee pain; worse on left; tolerating tylenol taking twice daily scheduled and PRN pain also has biofreeze to knees as needed   Review of Systems:  Review of Systems  Constitutional: Negative for fever, chills and weight loss.  HENT: Negative for congestion and sore throat.   Respiratory: Negative for cough and shortness of breath.   Cardiovascular: Negative for chest pain and leg swelling.  Gastrointestinal: Negative for heartburn, abdominal pain, diarrhea and constipation.  Genitourinary: Negative for dysuria.  Musculoskeletal: Positive for joint pain (reports worsening of knee pain). Negative for myalgias.  Skin: Negative.   Neurological: Negative for dizziness, weakness and headaches.  Psychiatric/Behavioral: Positive for memory loss. Negative for depression. The patient does not have insomnia.      Past Medical History  Diagnosis Date  . Stroke 02/2013  . Diabetes mellitus without complication   . Hypertension   . Dementia   . Asthma   . 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 History of prostate cancer     in Remission   . Chronic back pain    No past surgical history on file. Social History:   reports that he has never smoked. He  does not have any smokeless tobacco history on file. He reports that he does not drink alcohol or use illicit drugs.  No family history on file.  Medications: Patient's Medications  New Prescriptions   No medications on file  Previous Medications   ACETAMINOPHEN (TYLENOL 8 HOUR) 650 MG CR TABLET    Will schedule every 12 hours but may have additional dose if needed   ALBUTEROL (PROVENTIL HFA;VENTOLIN HFA) 108 (90 BASE) MCG/ACT INHALER    Inhale 2 puffs into the lungs every 6 (six) hours as needed for wheezing.   ASPIRIN EC 81 MG EC TABLET    Take 1 tablet (81 mg total) by mouth daily.   ATORVASTATIN (LIPITOR) 40 MG TABLET    Take 20 mg by mouth daily.    CLOPIDOGREL (PLAVIX) 75 MG TABLET    Take 75 mg by mouth daily.   DONEPEZIL (ARICEPT) 10 MG TABLET    Take 1 tablet (10 mg total) by mouth at bedtime.   METFORMIN (GLUCOPHAGE) 500 MG TABLET    Take 500 mg by mouth 2 (two) times daily with a meal.   SAXAGLIPTIN HCL (ONGLYZA) 2.5 MG TABS TABLET    Take 2.5 mg by mouth daily.   TAMSULOSIN (FLOMAX) 0.4 MG CAPS    Take by mouth.   VALSARTAN (DIOVAN) 160 MG TABLET    Take 320 mg by mouth daily.   Modified Medications   No medications on file  Discontinued Medications  No medications on file     Physical Exam: Physical Exam  Constitutional: He is well-developed, well-nourished, and in no distress. No distress.  HENT:  Head: Normocephalic and atraumatic.  Eyes: Conjunctivae and EOM are normal. Pupils are equal, round, and reactive to light.  Neck: Normal range of motion. Neck supple. No thyromegaly present.  Cardiovascular: Normal rate, regular rhythm and normal heart sounds.   Pulmonary/Chest: Effort normal and breath sounds normal. No respiratory distress.  Abdominal: Soft. Bowel sounds are normal. He exhibits no distension. There is no tenderness.  Musculoskeletal: Normal range of motion. He exhibits tenderness (to bilateral knees). He exhibits no edema.  Self propels in wc    Neurological: He is alert.  Skin: Skin is warm and dry. He is not diaphoretic.  Psychiatric: Affect normal.    Filed Vitals:   12/23/13 1427  BP: 115/72  Pulse: 82  Temp: 97.4 F (36.3 C)  Resp: 20      Labs reviewed: Basic Metabolic Panel:  Recent Labs  04/03/13 1243 04/04/13 0544  NA 139 136  K 3.1* 3.5  CL 102 105  CO2 29 23  GLUCOSE 142* 177*  BUN 14 11  CREATININE 0.73 0.60  CALCIUM 9.2 8.5   Liver Function Tests:  Recent Labs  04/03/13 1243 04/04/13 0544  AST 107* 72*  ALT 91* 70*  ALKPHOS 74 63  BILITOT 0.5 0.5  PROT 8.0 6.8  ALBUMIN 3.1* 2.5*   No results found for this basename: LIPASE, AMYLASE,  in the last 8760 hours No results found for this basename: AMMONIA,  in the last 8760 hours CBC:  Recent Labs  04/03/13 1243 04/04/13 0544  WBC 6.4 5.9  NEUTROABS 3.3  --   HGB 14.4 13.3  HCT 39.8 37.4*  MCV 88.6 88.0  PLT 180 150   Cardiac Enzymes:  Recent Labs  04/03/13 1243  CKTOTAL 136   BNP: No components found with this basename: POCBNP,  CBG:  Recent Labs  04/07/13 2207 04/08/13 0806 04/08/13 1205  GLUCAP 176* 168* 237*   TSH: No results found for this basename: TSH,  in the last 8760 hours A1C: Lab Results  Component Value Date   HGBA1C 7.7* 04/04/2013    Assessment/Plan 1. Hypertension -Patient is stable; continue current regimen. Will monitor and make changes as necessary.  2. Diabetes mellitus type 2 in nonobese -A1c remains stable; will cont current medicatins  3. Generalized OA -complaining of worsening knee pain to left knee; worse with movement, no swelling or heat -will increase scheduled tylenol to TID -to cont biofreeze QID   4. Memory loss -stable in current environment; little changes in memory in the last few months   5. Other and unspecified hyperlipidemia - Lipitor has been decreased to 20 mg; cholesterol remains stable

## 2014-01-20 ENCOUNTER — Non-Acute Institutional Stay (SKILLED_NURSING_FACILITY): Payer: Medicare Other | Admitting: Nurse Practitioner

## 2014-01-20 ENCOUNTER — Encounter: Payer: Self-pay | Admitting: Nurse Practitioner

## 2014-01-20 DIAGNOSIS — R000 Tachycardia, unspecified: Secondary | ICD-10-CM

## 2014-01-20 DIAGNOSIS — Z8673 Personal history of transient ischemic attack (TIA), and cerebral infarction without residual deficits: Secondary | ICD-10-CM

## 2014-01-20 DIAGNOSIS — E119 Type 2 diabetes mellitus without complications: Secondary | ICD-10-CM

## 2014-01-20 DIAGNOSIS — R413 Other amnesia: Secondary | ICD-10-CM

## 2014-01-20 DIAGNOSIS — M159 Polyosteoarthritis, unspecified: Secondary | ICD-10-CM

## 2014-01-20 DIAGNOSIS — I10 Essential (primary) hypertension: Secondary | ICD-10-CM

## 2014-01-20 NOTE — Progress Notes (Signed)
Patient ID: Jerry Stanley, male   DOB: Jun 20, 1941, 73 y.o.   MRN: [id number]    Nursing Home Location:  [facility]   Place of Service: SNF (31)  PCP: Default, Provider, MD  No Known Allergies  Chief Complaint  Patient presents with  . Medical Managment of Chronic Issues    HPI:  HPI:  Jerry Stanley is a 73 year old AA male who is seen today for follow up and maintenance of multiple co-morbidites including CVA, DM, HTN, Dementia, Asthma, Cardiomyopathy, and Prostate Cancer. Today he was found to be with an irregular tachycardia ranging from baseline 80's to 140s range at rest. Patient up OOB sitting in WC in NAD. Review of Systems:  GENERAL: Denies ill-feeling or complaint.  EYES: Denies visual problems.  EARS: No earache or change in hearing  MOUTH/THROAT: Denies pain or dysphagia  RESPIRATORY: No cough, wheezing, SOB  CARDIAC: No chest pain, palpitations, lower extremity edema  GI: No abdominal pain, No N/V/D or constipation  GU: No dysuria, frequency or urgency, some incontinency reported by nursing  MUSCULOSKELETAL: No unrelieved bone/joint pain  NEUROLOGIC: Denies weakness, dizziness, h/a  PSYCHIATRIC: No psychosis reported per nursing. Nursing reports more somnolence and withdrawn behavior.  AMBULATORY: Less mobile per staff; WC bound.     Past Medical History  Diagnosis Date  . Stroke 02/2013  . Diabetes mellitus without complication   . Hypertension   . Dementia   . Asthma   . 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 History of prostate cancer     in Remission   . Chronic back pain    No past surgical history on file. Social History:   reports that he has never smoked. He does not have any smokeless tobacco history on file. He reports that he does not drink alcohol or use illicit drugs.  No family history on file.  Medications: Patient's Medications  New Prescriptions   No medications on file  Previous Medications   ACETAMINOPHEN (TYLENOL 8 HOUR) 650 MG CR TABLET    Will schedule every 12 hours but may have additional dose if needed   ALBUTEROL (PROVENTIL HFA;VENTOLIN HFA) 108 (90 BASE) MCG/ACT INHALER    Inhale 2 puffs into the lungs every 6 (six) hours as needed for wheezing.   ASPIRIN EC 81 MG EC TABLET    Take 1 tablet (81 mg total) by mouth daily.   ATORVASTATIN (LIPITOR) 40 MG TABLET    Take 20 mg by mouth daily.    CLOPIDOGREL (PLAVIX) 75 MG TABLET    Take 75 mg by mouth daily.   DONEPEZIL (ARICEPT) 10 MG TABLET    Take 1 tablet (10 mg total) by mouth at bedtime.   METFORMIN (GLUCOPHAGE) 500 MG TABLET    Take 500 mg by mouth 2 (two) times daily with a meal.   SAXAGLIPTIN HCL (ONGLYZA) 2.5 MG TABS TABLET    Take 2.5 mg by mouth daily.   TAMSULOSIN (FLOMAX) 0.4 MG CAPS    Take by mouth.   VALSARTAN (DIOVAN) 160 MG TABLET    Take 320 mg by mouth daily.   Modified Medications   No medications on file  Discontinued Medications   No medications on file     Physical Exam:  Filed Vitals:   01/20/14 1317  BP: 98/48  Pulse: 130  Temp: 98.6 F (37 C)  Resp: 20  SpO2: 98%   GENERAL APPEARANCE: Sitting up in WC in room, head bowed. Does not look up  when name called. Withdrawn and somnolent. SKIN: Skin warm and dry. Color WNL.  HEAD: Normocephalic, atraumatic.  EARS: External exam WNL, mildly HOH RESPIRATORY: Breathing is even, unlabored. Lung sounds are clear and without wheezing, rhoncii, or crackles. O2 sat high 90's. CARDIOVASCULAR: Heart RRR no murmurs, rubs or gallops. No LE edema present.  GASTROINTESTINAL: Abdomen is soft, non-tender, not distended w/ normal bowel sounds. Tolerating po food (modified consistency) and fluids without difficulty. GENITOURINARY: Bladder non tender, not distended. Adult brief noted. MUSCULOSKELETAL: No abnormal joints or musculature tenderness. Moves hands and legs actively when prompted. Left leg weaker than right leg. NEUROLOGIC: Affect withdrawn. Patient  arouses with prompting. Follows commands and makes eye contact.      Labs reviewed: Basic Metabolic Panel:  Recent Labs  04/03/13 1243 04/04/13 0544  NA 139 136  K 3.1* 3.5  CL 102 105  CO2 29 23  GLUCOSE 142* 177*  BUN 14 11  CREATININE 0.73 0.60  CALCIUM 9.2 8.5   Liver Function Tests:  Recent Labs  04/03/13 1243 04/04/13 0544  AST 107* 72*  ALT 91* 70*  ALKPHOS 74 63  BILITOT 0.5 0.5  PROT 8.0 6.8  ALBUMIN 3.1* 2.5*   No results found for this basename: LIPASE, AMYLASE,  in the last 8760 hours No results found for this basename: AMMONIA,  in the last 8760 hours CBC:  Recent Labs  04/03/13 1243 04/04/13 0544  WBC 6.4 5.9  NEUTROABS 3.3  --   HGB 14.4 13.3  HCT 39.8 37.4*  MCV 88.6 88.0  PLT 180 150   Cardiac Enzymes:  Recent Labs  04/03/13 1243  CKTOTAL 136   BNP: No components found with this basename: POCBNP,  CBG:  Recent Labs  04/07/13 2207 04/08/13 0806 04/08/13 1205  GLUCAP 176* 168* 237*   TSH: No results found for this basename: TSH,  in the last 8760 hours A1C: Lab Results  Component Value Date   HGBA1C 7.7* 04/04/2013    Assessment/Plan 1. Hypertension Stable on current regimen, will continue to monitor.     2. Diabetes mellitus type 2 in nonobese Stable on metformin and onglyza. Will continue these.   3. History of stroke Continue ASA, Plavix, and Lipitor. Tolerating meds without bleeding. Will continue to monitor mental status and address possibility of TIA.  4. Memory loss/Dementia Continue Aricept daily. Follow mental status closely as cognitive status responds to treatment of today's cardiac episode.  5. Osteoarthritis -denies pain; conts current therapy   6. Tachycardia Concerned for Afib. Will obtain stat EKG, CMP, CBC and Mag. -will get UA C&S  -Will give 500cc NS by Clysis and encourage po fluids.  -will start metoprolol 25 mg BID and for increase HR; will have staff take VS q 8 hours -and get  cardiology referral at this time

## 2014-02-03 ENCOUNTER — Ambulatory Visit: Payer: Medicare Other | Admitting: Cardiology

## 2014-02-04 ENCOUNTER — Non-Acute Institutional Stay (SKILLED_NURSING_FACILITY): Payer: Medicare Other | Admitting: Internal Medicine

## 2014-02-04 ENCOUNTER — Encounter: Payer: Self-pay | Admitting: Internal Medicine

## 2014-02-04 DIAGNOSIS — N390 Urinary tract infection, site not specified: Secondary | ICD-10-CM

## 2014-02-04 DIAGNOSIS — F919 Conduct disorder, unspecified: Secondary | ICD-10-CM

## 2014-02-04 DIAGNOSIS — R4689 Other symptoms and signs involving appearance and behavior: Secondary | ICD-10-CM

## 2014-02-04 NOTE — Progress Notes (Signed)
MRN: [id number] Name: Jerry Stanley  Sex: male Age: 73 y.o. DOB: [birth year]  [place] #: Helene Kelp Facility/Room: 222A Level Of Care: SNF Provider: Inocencio Homes D Emergency Contacts: Extended Emergency Contact Information Primary Emergency Contact: Bullard-Bowen,Delaine Address: [place], [place] [address] [place] Phone: [phone] Mobile Phone: [phone] Relation: Sister    Allergies: Review of patient's allergies indicates no known allergies.  Chief Complaint  Patient presents with  . Acute Visit    HPI: Patient is 73 y.o. male who the wound nurse has asked me to see because pt has 2 new superficial wounds and is not eating well. In fact not doing as much of anything as he was maybe a month ago.  Past Medical History  Diagnosis Date  . Stroke 02/2013  . Diabetes mellitus without complication   . Hypertension   . Dementia   . Asthma   . 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 History of prostate cancer     in Remission   . Chronic back pain     History reviewed. No pertinent past surgical history.    Medication List       This list is accurate as of: 02/04/14 10:41 PM.  Always use your most recent med list.               acetaminophen 650 MG CR tablet  Commonly known as:  TYLENOL 8 HOUR  Will schedule every 12 hours but may have additional dose if needed     albuterol 108 (90 BASE) MCG/ACT inhaler  Commonly known as:  PROVENTIL HFA;VENTOLIN HFA  Inhale 2 puffs into the lungs every 6 (six) hours as needed for wheezing.     aspirin 81 MG EC tablet  Take 1 tablet (81 mg total) by mouth daily.     atorvastatin 40 MG tablet  Commonly known as:  LIPITOR  Take 20 mg by mouth daily.     clopidogrel 75 MG tablet  Commonly known as:  PLAVIX  Take 75 mg by mouth daily.     donepezil 10 MG tablet  Commonly known as:  ARICEPT  Take 1 tablet (10 mg total) by mouth at bedtime.     metFORMIN 500 MG  tablet  Commonly known as:  GLUCOPHAGE  Take 500 mg by mouth 2 (two) times daily with a meal.     saxagliptin HCl 2.5 MG Tabs tablet  Commonly known as:  ONGLYZA  Take 2.5 mg by mouth daily.     tamsulosin 0.4 MG Caps capsule  Commonly known as:  FLOMAX  Take by mouth.     valsartan 160 MG tablet  Commonly known as:  DIOVAN  Take 320 mg by mouth daily.        No orders of the defined types were placed in this encounter.    Immunization History  Administered Date(s) Administered  . Pneumococcal Polysaccharide-23 04/05/2013    History  Substance Use Topics  . Smoking status: Never Smoker   . Smokeless tobacco: Not on file  . Alcohol Use: No    Review of Systems  DATA OBTAINED: from patient, nurse, medical record GENERAL: Feels well no fevers,+ fatigue, +appetite changes SKIN: No itching, rash HEENT: No complaint RESPIRATORY: No cough, wheezing, SOB CARDIAC: No chest pain, palpitations, lower extremity edema  GI: No abdominal pain, No N/V/D or constipation, No heartburn or reflux  GU: No dysuria, frequency or urgency, or incontinence  MUSCULOSKELETAL: No unrelieved bone/joint pain NEUROLOGIC: No headache, dizziness or new focal weakness PSYCHIATRIC: No overt anxiety or sadness. Sleeps well.   Filed Vitals:   02/04/14 2238  BP: 130/75  Pulse: 82  Temp: 97.4 F (36.3 C)  Resp: 20    Physical Exam  GENERAL APPEARANCE: Alert,non conversant. Appropriately groomed. No acute distress  SKIN: No diaphoresis rash HEENT: Unremarkable RESPIRATORY: Breathing is even, unlabored. Lung sounds are clear   CARDIOVASCULAR: Heart RRR no murmurs, rubs or gallops. No peripheral edema  GASTROINTESTINAL: Abdomen is soft, non-tender, not distended w/ normal bowel sounds.  GENITOURINARY: Bladder non tender, not distended  MUSCULOSKELETAL: No abnormal joints or musculature NEUROLOGIC: Cranial nerves 2-12 grossly intact. Moves all extremities no tremor. PSYCHIATRIC:  no  behavioral issues  Patient Active Problem List   Diagnosis Date Noted  . Other and unspecified hyperlipidemia 09/23/2013  . Generalized OA 08/05/2013  . Memory loss 07/17/2013  . Urinary incontinence 04/03/2013  . UTI (lower urinary tract infection) 04/03/2013  . Hypertension 04/03/2013  . History of stroke 04/03/2013  . Hyperlipemia 04/03/2013  . Diabetes mellitus type 2 in nonobese 04/03/2013    CBC    Component Value Date/Time   WBC 5.9 04/04/2013 0544   RBC 4.25 04/04/2013 0544   HGB 13.3 04/04/2013 0544   HCT 37.4* 04/04/2013 0544   PLT 150 04/04/2013 0544   MCV 88.0 04/04/2013 0544   LYMPHSABS 2.3 04/03/2013 1243   MONOABS 0.7 04/03/2013 1243   EOSABS 0.0 04/03/2013 1243   BASOSABS 0.0 04/03/2013 1243    CMP     Component Value Date/Time   NA 136 04/04/2013 0544   K 3.5 04/04/2013 0544   CL 105 04/04/2013 0544   CO2 23 04/04/2013 0544   GLUCOSE 177* 04/04/2013 0544   BUN 11 04/04/2013 0544   CREATININE 0.60 04/04/2013 0544   CALCIUM 8.5 04/04/2013 0544   PROT 6.8 04/04/2013 0544   ALBUMIN 2.5* 04/04/2013 0544   AST 72* 04/04/2013 0544   ALT 70* 04/04/2013 0544   ALKPHOS 63 04/04/2013 0544   BILITOT 0.5 04/04/2013 0544   GFRNONAA >90 04/04/2013 0544   GFRAA >90 04/04/2013 0544    Assessment and Plan  CHANGE IN BEHAVIOR/ UTI-  After exam of pt full review of chart was done. This revealed that pt had a U/A return with >100,000 proteus from 2/6 but there was no C and S on the chart. I looked myself for the  C and S and found his nurse who looked up his meds to confirm whether or not he had received an antibiotic. He had not and I found the C and Sreport.  Ampicillin 875 mg BID for 10 days, florastor 250 BID for 14 days. Also repeat U/A with C and S after treatment completed. Will monitor until behavior improves back to baseline. UTI is the probable cause  Hennie Duos, MD

## 2014-02-09 ENCOUNTER — Encounter: Payer: Self-pay | Admitting: Internal Medicine

## 2014-02-09 ENCOUNTER — Non-Acute Institutional Stay (SKILLED_NURSING_FACILITY): Payer: Medicare Other | Admitting: Internal Medicine

## 2014-02-09 DIAGNOSIS — E860 Dehydration: Secondary | ICD-10-CM

## 2014-02-09 DIAGNOSIS — N390 Urinary tract infection, site not specified: Secondary | ICD-10-CM

## 2014-02-09 NOTE — Progress Notes (Signed)
MRN: [id number] Name: Jerry Stanley  Sex: male Age: 73 y.o. DOB: 09/26/1941  Arnold #: Helene Kelp Facility/Room: 222A Level Of Care: SNF Provider: Inocencio Homes D Emergency Contacts: Extended Emergency Contact Information Primary Emergency Contact: Bullard-Bowen,Delaine Address: [place], [place] [address] [place] Phone: [phone] Mobile Phone: [phone] Relation: Sister  Code Status: FULL  Allergies: Review of patient's allergies indicates no known allergies.  Chief Complaint  Patient presents with  . Acute Visit    HPI: Patient is 73 y.o. male who the nurses ask  Past Medical History  Diagnosis Date  . Stroke 02/2013  . Diabetes mellitus without complication   . Hypertension   . Dementia   . Asthma   . 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 History of prostate cancer     in Remission   . Chronic back pain     No past surgical history on file.    Medication List       This list is accurate as of: 02/09/14  2:42 PM.  Always use your most recent med list.               acetaminophen 650 MG CR tablet  Commonly known as:  TYLENOL 8 HOUR  Will schedule every 12 hours but may have additional dose if needed     albuterol 108 (90 BASE) MCG/ACT inhaler  Commonly known as:  PROVENTIL HFA;VENTOLIN HFA  Inhale 2 puffs into the lungs every 6 (six) hours as needed for wheezing.     aspirin 81 MG EC tablet  Take 1 tablet (81 mg total) by mouth daily.     atorvastatin 40 MG tablet  Commonly known as:  LIPITOR  Take 20 mg by mouth daily.     clopidogrel 75 MG tablet  Commonly known as:  PLAVIX  Take 75 mg by mouth daily.     donepezil 10 MG tablet  Commonly known as:  ARICEPT  Take 1 tablet (10 mg total) by mouth at bedtime.     metFORMIN 500 MG tablet  Commonly known as:  GLUCOPHAGE  Take 500 mg by mouth 2 (two) times daily with a meal.     saxagliptin HCl 2.5 MG Tabs tablet  Commonly known  as:  ONGLYZA  Take 2.5 mg by mouth daily.     tamsulosin 0.4 MG Caps capsule  Commonly known as:  FLOMAX  Take by mouth.     valsartan 160 MG tablet  Commonly known as:  DIOVAN  Take 320 mg by mouth daily.        No orders of the defined types were placed in this encounter.    Immunization History  Administered Date(s) Administered  . Pneumococcal Polysaccharide-23 04/05/2013    History  Substance Use Topics  . Smoking status: Never Smoker   . Smokeless tobacco: Not on file  . Alcohol Use: No    Review of Systems  DATA OBTAINED: from patient, nurse, medical record, family member GENERAL: Feels well no fevers, fatigue, appetite changes SKIN: No itching, rash HEENT: No complaint RESPIRATORY: No cough, wheezing, SOB CARDIAC: No chest pain, palpitations, lower extremity edema  GI: No abdominal pain, No N/V/D or constipation, No heartburn or reflux  GU: No dysuria, frequency or urgency, or incontinence  MUSCULOSKELETAL: No unrelieved bone/joint pain NEUROLOGIC: No headache, dizziness or focal weakness PSYCHIATRIC: No overt anxiety or sadness. Sleeps well.   Filed Vitals:   02/09/14  1439  BP: 82/57  Pulse: 111  Temp: 98.7 F (37.1 C)  Resp: 19    Physical Exam  GENERAL APPEARANCE: Alert, conversant. Appropriately groomed. No acute distress  SKIN: No diaphoresis rash, or wounds HEENT: Unremarkable RESPIRATORY: Breathing is even, unlabored. Lung sounds are clear   CARDIOVASCULAR: Heart RRR no murmurs, rubs or gallops. No peripheral edema  GASTROINTESTINAL: Abdomen is soft, non-tender, not distended w/ normal bowel sounds.  GENITOURINARY: Bladder non tender, not distended  MUSCULOSKELETAL: No abnormal joints or musculature NEUROLOGIC: Cranial nerves 2-12 grossly intact. Moves all extremities no tremor. PSYCHIATRIC: Mood and affect appropriate to situation, no behavioral issues  Patient Active Problem List   Diagnosis Date Noted  . Other and unspecified  hyperlipidemia 09/23/2013  . Generalized OA 08/05/2013  . Memory loss 07/17/2013  . Urinary incontinence 04/03/2013  . UTI (lower urinary tract infection) 04/03/2013  . Hypertension 04/03/2013  . History of stroke 04/03/2013  . Hyperlipemia 04/03/2013  . Diabetes mellitus type 2 in nonobese 04/03/2013    CBC    Component Value Date/Time   WBC 5.9 04/04/2013 0544   RBC 4.25 04/04/2013 0544   HGB 13.3 04/04/2013 0544   HCT 37.4* 04/04/2013 0544   PLT 150 04/04/2013 0544   MCV 88.0 04/04/2013 0544   LYMPHSABS 2.3 04/03/2013 1243   MONOABS 0.7 04/03/2013 1243   EOSABS 0.0 04/03/2013 1243   BASOSABS 0.0 04/03/2013 1243    CMP     Component Value Date/Time   NA 136 04/04/2013 0544   K 3.5 04/04/2013 0544   CL 105 04/04/2013 0544   CO2 23 04/04/2013 0544   GLUCOSE 177* 04/04/2013 0544   BUN 11 04/04/2013 0544   CREATININE 0.60 04/04/2013 0544   CALCIUM 8.5 04/04/2013 0544   PROT 6.8 04/04/2013 0544   ALBUMIN 2.5* 04/04/2013 0544   AST 72* 04/04/2013 0544   ALT 70* 04/04/2013 0544   ALKPHOS 63 04/04/2013 0544   BILITOT 0.5 04/04/2013 0544   GFRNONAA >90 04/04/2013 0544   GFRAA >90 04/04/2013 0544    Assessment and Plan  CHANGE IN BEHAVOIR- Pt was seen 5 days ago for not acing himself and was a t that time started on Ampicillin 500 mg TID from the C and S of his urine cx; he has not improved and per phone last night pt was started on IVF sec to poor po intake; BMP was ordered for today and as of tonight it has not returned. IVF will be continued for total 2 liters then re-evaluated. I wonder if decline is more global. A family member is apparently talking about a PEG tube. We need to have a family conference. Hopefully tx of UTI and rehydration will improve him.   Hennie Duos, MD

## 2014-02-10 ENCOUNTER — Encounter (HOSPITAL_COMMUNITY): Payer: Self-pay | Admitting: Emergency Medicine

## 2014-02-10 ENCOUNTER — Emergency Department (HOSPITAL_COMMUNITY): Payer: Medicare Other

## 2014-02-10 ENCOUNTER — Inpatient Hospital Stay (HOSPITAL_COMMUNITY)
Admission: EM | Admit: 2014-02-10 | Discharge: 2014-02-17 | DRG: 871 | Payer: Medicare Other | Attending: Internal Medicine | Admitting: Internal Medicine

## 2014-02-10 ENCOUNTER — Inpatient Hospital Stay (HOSPITAL_COMMUNITY): Payer: Medicare Other

## 2014-02-10 DIAGNOSIS — E870 Hyperosmolality and hypernatremia: Secondary | ICD-10-CM | POA: Diagnosis present

## 2014-02-10 DIAGNOSIS — Z8673 Personal history of transient ischemic attack (TIA), and cerebral infarction without residual deficits: Secondary | ICD-10-CM

## 2014-02-10 DIAGNOSIS — R413 Other amnesia: Secondary | ICD-10-CM

## 2014-02-10 DIAGNOSIS — M549 Dorsalgia, unspecified: Secondary | ICD-10-CM | POA: Diagnosis present

## 2014-02-10 DIAGNOSIS — K909 Intestinal malabsorption, unspecified: Secondary | ICD-10-CM | POA: Diagnosis present

## 2014-02-10 DIAGNOSIS — L89109 Pressure ulcer of unspecified part of back, unspecified stage: Secondary | ICD-10-CM | POA: Diagnosis present

## 2014-02-10 DIAGNOSIS — F0390 Unspecified dementia without behavioral disturbance: Secondary | ICD-10-CM | POA: Diagnosis present

## 2014-02-10 DIAGNOSIS — R579 Shock, unspecified: Secondary | ICD-10-CM

## 2014-02-10 DIAGNOSIS — M159 Polyosteoarthritis, unspecified: Secondary | ICD-10-CM

## 2014-02-10 DIAGNOSIS — I4891 Unspecified atrial fibrillation: Secondary | ICD-10-CM | POA: Diagnosis present

## 2014-02-10 DIAGNOSIS — E43 Unspecified severe protein-calorie malnutrition: Secondary | ICD-10-CM | POA: Diagnosis present

## 2014-02-10 DIAGNOSIS — R6521 Severe sepsis with septic shock: Secondary | ICD-10-CM

## 2014-02-10 DIAGNOSIS — R740 Nonspecific elevation of levels of transaminase and lactic acid dehydrogenase [LDH]: Secondary | ICD-10-CM

## 2014-02-10 DIAGNOSIS — E119 Type 2 diabetes mellitus without complications: Secondary | ICD-10-CM | POA: Diagnosis present

## 2014-02-10 DIAGNOSIS — Z1612 Extended spectrum beta lactamase (ESBL) resistance: Secondary | ICD-10-CM

## 2014-02-10 DIAGNOSIS — Z7982 Long term (current) use of aspirin: Secondary | ICD-10-CM

## 2014-02-10 DIAGNOSIS — E872 Acidosis, unspecified: Secondary | ICD-10-CM | POA: Diagnosis present

## 2014-02-10 DIAGNOSIS — R509 Fever, unspecified: Secondary | ICD-10-CM

## 2014-02-10 DIAGNOSIS — L89309 Pressure ulcer of unspecified buttock, unspecified stage: Secondary | ICD-10-CM | POA: Diagnosis present

## 2014-02-10 DIAGNOSIS — N19 Unspecified kidney failure: Secondary | ICD-10-CM

## 2014-02-10 DIAGNOSIS — A499 Bacterial infection, unspecified: Secondary | ICD-10-CM | POA: Diagnosis present

## 2014-02-10 DIAGNOSIS — N179 Acute kidney failure, unspecified: Secondary | ICD-10-CM | POA: Diagnosis present

## 2014-02-10 DIAGNOSIS — E876 Hypokalemia: Secondary | ICD-10-CM | POA: Diagnosis present

## 2014-02-10 DIAGNOSIS — N138 Other obstructive and reflux uropathy: Secondary | ICD-10-CM | POA: Diagnosis present

## 2014-02-10 DIAGNOSIS — R0902 Hypoxemia: Secondary | ICD-10-CM | POA: Diagnosis present

## 2014-02-10 DIAGNOSIS — A498 Other bacterial infections of unspecified site: Secondary | ICD-10-CM | POA: Diagnosis present

## 2014-02-10 DIAGNOSIS — E860 Dehydration: Secondary | ICD-10-CM

## 2014-02-10 DIAGNOSIS — E785 Hyperlipidemia, unspecified: Secondary | ICD-10-CM

## 2014-02-10 DIAGNOSIS — R5381 Other malaise: Secondary | ICD-10-CM | POA: Diagnosis present

## 2014-02-10 DIAGNOSIS — N390 Urinary tract infection, site not specified: Secondary | ICD-10-CM | POA: Diagnosis present

## 2014-02-10 DIAGNOSIS — Z8546 Personal history of malignant neoplasm of prostate: Secondary | ICD-10-CM

## 2014-02-10 DIAGNOSIS — A419 Sepsis, unspecified organism: Principal | ICD-10-CM | POA: Diagnosis present

## 2014-02-10 DIAGNOSIS — Z66 Do not resuscitate: Secondary | ICD-10-CM | POA: Diagnosis present

## 2014-02-10 DIAGNOSIS — N401 Enlarged prostate with lower urinary tract symptoms: Secondary | ICD-10-CM | POA: Diagnosis present

## 2014-02-10 DIAGNOSIS — I10 Essential (primary) hypertension: Secondary | ICD-10-CM | POA: Diagnosis present

## 2014-02-10 DIAGNOSIS — I421 Obstructive hypertrophic cardiomyopathy: Secondary | ICD-10-CM | POA: Diagnosis present

## 2014-02-10 DIAGNOSIS — R6520 Severe sepsis without septic shock: Secondary | ICD-10-CM

## 2014-02-10 DIAGNOSIS — D72829 Elevated white blood cell count, unspecified: Secondary | ICD-10-CM

## 2014-02-10 DIAGNOSIS — L8995 Pressure ulcer of unspecified site, unstageable: Secondary | ICD-10-CM | POA: Diagnosis present

## 2014-02-10 DIAGNOSIS — Z9981 Dependence on supplemental oxygen: Secondary | ICD-10-CM

## 2014-02-10 DIAGNOSIS — R32 Unspecified urinary incontinence: Secondary | ICD-10-CM

## 2014-02-10 DIAGNOSIS — R7401 Elevation of levels of liver transaminase levels: Secondary | ICD-10-CM

## 2014-02-10 DIAGNOSIS — B9620 Unspecified Escherichia coli [E. coli] as the cause of diseases classified elsewhere: Secondary | ICD-10-CM | POA: Diagnosis present

## 2014-02-10 DIAGNOSIS — G9340 Encephalopathy, unspecified: Secondary | ICD-10-CM | POA: Diagnosis present

## 2014-02-10 DIAGNOSIS — R197 Diarrhea, unspecified: Secondary | ICD-10-CM | POA: Diagnosis present

## 2014-02-10 DIAGNOSIS — G8929 Other chronic pain: Secondary | ICD-10-CM | POA: Diagnosis present

## 2014-02-10 LAB — CBC
HCT: 42.1 % (ref 39.0–52.0)
HEMATOCRIT: 43.1 % (ref 39.0–52.0)
Hemoglobin: 14.4 g/dL (ref 13.0–17.0)
Hemoglobin: 14 g/dL (ref 13.0–17.0)
MCH: 32.8 pg (ref 26.0–34.0)
MCH: 32.7 pg (ref 26.0–34.0)
MCHC: 33.4 g/dL (ref 30.0–36.0)
MCHC: 33.3 g/dL (ref 30.0–36.0)
MCV: 98.2 fL (ref 78.0–100.0)
MCV: 98.4 fL (ref 78.0–100.0)
PLATELETS: 257 10*3/uL (ref 150–400)
PLATELETS: 216 10*3/uL (ref 150–400)
RBC: 4.39 MIL/uL (ref 4.22–5.81)
RBC: 4.28 MIL/uL (ref 4.22–5.81)
RDW: 13.4 % (ref 11.5–15.5)
RDW: 13.6 % (ref 11.5–15.5)
WBC: 15.1 10*3/uL — ABNORMAL HIGH (ref 4.0–10.5)
WBC: 13.1 10*3/uL — ABNORMAL HIGH (ref 4.0–10.5)

## 2014-02-10 LAB — CARBOXYHEMOGLOBIN
Carboxyhemoglobin: 0.9 % (ref 0.5–1.5)
Methemoglobin: 0.8 % (ref 0.0–1.5)
O2 SAT: 70.9 %
TOTAL HEMOGLOBIN: 13.3 g/dL — AB (ref 13.5–18.0)

## 2014-02-10 LAB — CREATININE, SERUM
CREATININE: 2.41 mg/dL — AB (ref 0.50–1.35)
GFR calc Af Amer: 29 mL/min — ABNORMAL LOW (ref >90)
GFR, EST NON AFRICAN AMERICAN: 25 mL/min — AB (ref >90)

## 2014-02-10 LAB — COMPREHENSIVE METABOLIC PANEL
ALT: 285 U/L — ABNORMAL HIGH (ref 0–53)
AST: 555 U/L — AB (ref 0–37)
Albumin: 2.4 g/dL — ABNORMAL LOW (ref 3.5–5.2)
Alkaline Phosphatase: 58 U/L (ref 39–117)
BILIRUBIN TOTAL: 1.6 mg/dL — AB (ref 0.3–1.2)
BUN: 93 mg/dL — AB (ref 6–23)
CALCIUM: 8.6 mg/dL (ref 8.4–10.5)
CHLORIDE: 116 meq/L — AB (ref 96–112)
CO2: 17 meq/L — AB (ref 19–32)
CREATININE: 2.77 mg/dL — AB (ref 0.50–1.35)
GFR calc Af Amer: 25 mL/min — ABNORMAL LOW (ref >90)
GFR, EST NON AFRICAN AMERICAN: 21 mL/min — AB (ref >90)
Glucose, Bld: 279 mg/dL — ABNORMAL HIGH (ref 70–99)
Potassium: 5 mEq/L (ref 3.7–5.3)
Sodium: 155 mEq/L — ABNORMAL HIGH (ref 137–147)
Total Protein: 8 g/dL (ref 6.0–8.3)

## 2014-02-10 LAB — GLUCOSE, CAPILLARY
Glucose-Capillary: 275 mg/dL — ABNORMAL HIGH (ref 70–99)
Glucose-Capillary: 208 mg/dL — ABNORMAL HIGH (ref 70–99)

## 2014-02-10 LAB — I-STAT CG4 LACTIC ACID, ED
Lactic Acid, Venous: 3.24 mmol/L — ABNORMAL HIGH (ref 0.5–2.2)
Lactic Acid, Venous: 1.9 mmol/L (ref 0.5–2.2)

## 2014-02-10 LAB — URINALYSIS, ROUTINE W REFLEX MICROSCOPIC
Glucose, UA: >1000 mg/dL — AB
Ketones, ur: NEGATIVE mg/dL
LEUKOCYTES UA: NEGATIVE
NITRITE: NEGATIVE
PH: 5 (ref 5.0–8.0)
Protein, ur: 30 mg/dL — AB
SPECIFIC GRAVITY, URINE: 1.027 (ref 1.005–1.030)
UROBILINOGEN UA: 2 mg/dL — AB (ref 0.0–1.0)

## 2014-02-10 LAB — TROPONIN I

## 2014-02-10 LAB — CBG MONITORING, ED: Glucose-Capillary: 282 mg/dL — ABNORMAL HIGH (ref 70–99)

## 2014-02-10 LAB — URINE MICROSCOPIC-ADD ON

## 2014-02-10 LAB — PROCALCITONIN: PROCALCITONIN: 1.17 ng/mL

## 2014-02-10 LAB — MRSA PCR SCREENING: MRSA BY PCR: NEGATIVE

## 2014-02-10 MED ORDER — PIPERACILLIN-TAZOBACTAM IN DEX 2-0.25 GM/50ML IV SOLN
2.2500 g | Freq: Three times a day (TID) | INTRAVENOUS | Status: DC
  Administered 2014-02-11 (×2): 2.25 g via INTRAVENOUS
  Filled 2014-02-10 (×6): qty 50

## 2014-02-10 MED ORDER — SODIUM CHLORIDE 0.45 % IV SOLN: INTRAVENOUS | Status: DC

## 2014-02-10 MED ORDER — SODIUM CHLORIDE 0.9 % IV SOLN
1000.0000 mL | INTRAVENOUS | Status: DC
  Administered 2014-02-10: 1000 mL via INTRAVENOUS

## 2014-02-10 MED ORDER — ACETAMINOPHEN 650 MG RE SUPP
650.0000 mg | Freq: Once | RECTAL | Status: AC
  Administered 2014-02-10: 650 mg via RECTAL
  Filled 2014-02-10: qty 1

## 2014-02-10 MED ORDER — SODIUM CHLORIDE 0.9 % IV BOLUS (SEPSIS)
500.0000 mL | Freq: Once | INTRAVENOUS | Status: AC
  Administered 2014-02-10: 500 mL via INTRAVENOUS

## 2014-02-10 MED ORDER — SODIUM CHLORIDE 0.9 % IV SOLN: 250.0000 mL | INTRAVENOUS | Status: DC | PRN

## 2014-02-10 MED ORDER — SODIUM CHLORIDE 0.9 % IV BOLUS (SEPSIS)
1000.0000 mL | INTRAVENOUS | Status: DC | PRN
  Administered 2014-02-10 (×3): 1000 mL via INTRAVENOUS

## 2014-02-10 MED ORDER — AMIODARONE HCL IN DEXTROSE 360-4.14 MG/200ML-% IV SOLN
60.0000 mg/h | INTRAVENOUS | Status: DC
  Administered 2014-02-10: 60 mg/h via INTRAVENOUS
  Filled 2014-02-10 (×2): qty 200

## 2014-02-10 MED ORDER — HEPARIN SODIUM (PORCINE) 5000 UNIT/ML IJ SOLN
5000.0000 [IU] | Freq: Three times a day (TID) | INTRAMUSCULAR | Status: DC
  Administered 2014-02-10 – 2014-02-16 (×15): 5000 [IU] via SUBCUTANEOUS
  Filled 2014-02-10 (×23): qty 1

## 2014-02-10 MED ORDER — SODIUM CHLORIDE 0.9 % IV SOLN
1000.0000 mL | Freq: Once | INTRAVENOUS | Status: AC
Start: 2014-02-10 — End: 2014-02-10
  Administered 2014-02-10: 1000 mL via INTRAVENOUS

## 2014-02-10 MED ORDER — INSULIN ASPART 100 UNIT/ML ~~LOC~~ SOLN: 0.0000 [IU] | Freq: Three times a day (TID) | SUBCUTANEOUS | Status: DC

## 2014-02-10 MED ORDER — AMIODARONE HCL IN DEXTROSE 360-4.14 MG/200ML-% IV SOLN
30.0000 mg/h | INTRAVENOUS | Status: DC
Start: 2014-02-10 — End: 2014-02-11
  Administered 2014-02-10 – 2014-02-11 (×2): 30 mg/h via INTRAVENOUS
  Filled 2014-02-10 (×2): qty 200

## 2014-02-10 MED ORDER — AMIODARONE HCL IN DEXTROSE 360-4.14 MG/200ML-% IV SOLN
60.0000 mg/h | INTRAVENOUS | Status: AC
  Administered 2014-02-10: 60 mg/h via INTRAVENOUS

## 2014-02-10 MED ORDER — INSULIN ASPART 100 UNIT/ML ~~LOC~~ SOLN
0.0000 [IU] | SUBCUTANEOUS | Status: DC
  Administered 2014-02-10: 3 [IU] via SUBCUTANEOUS
  Administered 2014-02-10: 5 [IU] via SUBCUTANEOUS
  Administered 2014-02-11 (×2): 1 [IU] via SUBCUTANEOUS
  Administered 2014-02-11: 2 [IU] via SUBCUTANEOUS
  Administered 2014-02-11: 1 [IU] via SUBCUTANEOUS
  Administered 2014-02-12 (×2): 2 [IU] via SUBCUTANEOUS

## 2014-02-10 MED ORDER — ASPIRIN 300 MG RE SUPP
300.0000 mg | RECTAL | Status: DC
  Filled 2014-02-10: qty 1

## 2014-02-10 MED ORDER — AMIODARONE HCL IN DEXTROSE 360-4.14 MG/200ML-% IV SOLN
30.0000 mg/h | INTRAVENOUS | Status: DC
Start: 2014-02-10 — End: 2014-02-10

## 2014-02-10 MED ORDER — NOREPINEPHRINE BITARTRATE 1 MG/ML IJ SOLN
2.0000 ug/min | INTRAVENOUS | Status: DC
  Administered 2014-02-11: 5 ug/min via INTRAVENOUS
  Filled 2014-02-10: qty 4

## 2014-02-10 MED ORDER — AMIODARONE LOAD VIA INFUSION
150.0000 mg | Freq: Once | INTRAVENOUS | Status: AC
  Administered 2014-02-10: 150 mg via INTRAVENOUS
  Filled 2014-02-10: qty 83.34

## 2014-02-10 MED ORDER — INSULIN ASPART 100 UNIT/ML ~~LOC~~ SOLN: 0.0000 [IU] | Freq: Every day | SUBCUTANEOUS | Status: DC

## 2014-02-10 MED ORDER — METOPROLOL TARTRATE 1 MG/ML IV SOLN: 2.5000 mg | INTRAVENOUS | Status: DC | PRN

## 2014-02-10 MED ORDER — SODIUM CHLORIDE 0.45 % IV SOLN
INTRAVENOUS | Status: DC
  Administered 2014-02-10: 100 mL/h via INTRAVENOUS
  Administered 2014-02-11: 06:00:00 via INTRAVENOUS

## 2014-02-10 MED ORDER — SODIUM CHLORIDE 0.9 % IV SOLN
1000.0000 mL | Freq: Once | INTRAVENOUS | Status: AC
  Administered 2014-02-10: 1000 mL via INTRAVENOUS

## 2014-02-10 MED ORDER — VANCOMYCIN HCL IN DEXTROSE 1-5 GM/200ML-% IV SOLN
1000.0000 mg | Freq: Once | INTRAVENOUS | Status: AC
  Administered 2014-02-10: 1000 mg via INTRAVENOUS
  Filled 2014-02-10: qty 200

## 2014-02-10 MED ORDER — SODIUM CHLORIDE 0.9 % IV SOLN: INTRAVENOUS | Status: DC

## 2014-02-10 MED ORDER — PIPERACILLIN-TAZOBACTAM 3.375 G IVPB 30 MIN
3.3750 g | Freq: Once | INTRAVENOUS | Status: AC
  Administered 2014-02-10: 3.375 g via INTRAVENOUS
  Filled 2014-02-10: qty 50

## 2014-02-10 MED ORDER — ASPIRIN 81 MG PO CHEW: 324.0000 mg | CHEWABLE_TABLET | ORAL | Status: DC

## 2014-02-10 MED ORDER — SODIUM CHLORIDE 0.9 % IV BOLUS (SEPSIS)
1000.0000 mL | Freq: Once | INTRAVENOUS | Status: AC
  Administered 2014-02-10: 1000 mL via INTRAVENOUS

## 2014-02-10 MED ORDER — VANCOMYCIN HCL IN DEXTROSE 1-5 GM/200ML-% IV SOLN: 1000.0000 mg | INTRAVENOUS | Status: DC

## 2014-02-10 NOTE — ED Notes (Signed)
CG-4 result shared with Dr. Raynald Blend, and admitting staff

## 2014-02-10 NOTE — ED Notes (Signed)
Patient's family members arrived at bedside and Critical Care is discussing plan and obtaining consent for central line.

## 2014-02-10 NOTE — H&P (Addendum)
Name: Jerry Stanley MRN: [id number] DOB: 1941/10/16    ADMISSION DATE:  02/10/2014 CONSULTATION DATE: 02/10/2014  REFERRING MD :  EDP PRIMARY SERVICE: PCCM  CHIEF COMPLAINT:  Hypotension  BRIEF PATIENT DESCRIPTION: 73 y.o. M from [place], brought in by EMS for hypotension, progressive lethargy over the past 2 weeks, and fever to 103 day of admit.  Initial BP in ER in 0000000 systolic.  PCCM asked to admit.  SIGNIFICANT EVENTS / STUDIES:  2/24 - admitted from [facility].  LINES / TUBES: PIV x 2  CULTURES: Blood x 2 2/24 >>> Urine 2/24 >>>  ANTIBIOTICS: Zosyn 2/24 >>> Vanc 2/24 >>>  HISTORY OF PRESENT ILLNESS:  Patient is currently minimally responsive and does not follow commands, therefore this HPI is obtained from chart review. Mr. Jerry Stanley is a 73 y.o. M with multiple co-morbidities admitted from [facility] for hypotension and progressive lethargy/decreased activity.  Per RN notes, staff at [facility] reported that over the past few weeks, Mr. Jerry Stanley has had decreased energy level and has become progressively more lethargic.  They also reported continued hypotension (0000000 - [id number] systolic) and SpO2 of 123456 on 3L [place].  Pt was also febrile to 103.  EMS was dispatched and upon arrival to [place], BP was again in the 0000000 systolic.  He only had one PIV in the right arm so a second was started in the left.  After 1 L of NS, BP responded to 102/88.  A 2nd liter of NS was started while in the ED.  Blood and urine cultures were obtained and sent. Extensive discussion between Dr. Alva Garnet with patients sister, Delaine regarding goals of care.  Based on patients current condition and PMH, decision was made to be DNR/DNI.  Care limited to pressors and antiarrhythmics.  PAST MEDICAL HISTORY :  Past Medical History  Diagnosis Date  . Stroke 02/2013  . Diabetes mellitus without complication   . Hypertension   . Dementia   . Asthma   . 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 History  of prostate cancer     in Remission   . Chronic back pain    History reviewed. No pertinent past surgical history. Prior to Admission medications   Medication Sig Start Date End Date Taking? Authorizing Provider  acetaminophen (TYLENOL 8 HOUR) 650 MG CR tablet Will schedule every 12 hours but may have additional dose if needed 08/05/13   Pricilla Larsson, NP  albuterol (PROVENTIL HFA;VENTOLIN HFA) 108 (90 BASE) MCG/ACT inhaler Inhale 2 puffs into the lungs every 6 (six) hours as needed for wheezing.    Historical Provider, MD  aspirin EC 81 MG EC tablet Take 1 tablet (81 mg total) by mouth daily. 04/08/13   Rosalia Hammers, MD  atorvastatin (LIPITOR) 40 MG tablet Take 20 mg by mouth daily.     Historical Provider, MD  clopidogrel (PLAVIX) 75 MG tablet Take 75 mg by mouth daily.    Historical Provider, MD  donepezil (ARICEPT) 10 MG tablet Take 1 tablet (10 mg total) by mouth at bedtime. 08/05/13   Pricilla Larsson, NP  metFORMIN (GLUCOPHAGE) 500 MG tablet Take 500 mg by mouth 2 (two) times daily with a meal.    Historical Provider, MD  saxagliptin HCl (ONGLYZA) 2.5 MG TABS tablet Take 2.5 mg by mouth daily.    Historical Provider, MD  tamsulosin (FLOMAX) 0.4 MG CAPS Take by mouth.    Historical Provider, MD  valsartan (DIOVAN) 160 MG tablet Take 320 mg by mouth daily.  Historical Provider, MD   No Known Allergies  FAMILY HISTORY:  No family history on file. SOCIAL HISTORY:  reports that he has never smoked. He does not have any smokeless tobacco history on file. He reports that he does not drink alcohol or use illicit drugs.  REVIEW OF SYSTEMS:  Unable to complete as pt does not answer questions.  SUBJECTIVE: Per RN, pt stable at this time.  2nd liter of NS started, pressure slowly responding.  VITAL SIGNS: Temp:  [102.7 F (39.3 C)] 102.7 F (39.3 C) (02/24 1303) Pulse Rate:  [121-152] 121 (02/24 1405) Resp:  [20-27] 20 (02/24 1410) BP: (62-123)/(43-105) 102/88 mmHg (02/24  1410) SpO2:  [86 %-100 %] 100 % (02/24 1410) Weight:  [150 lb (68.04 kg)] 150 lb (68.04 kg) (02/24 1303) HEMODYNAMICS:   VENTILATOR SETTINGS:   INTAKE / OUTPUT: Intake/Output   None     PHYSICAL EXAMINATION: General: Chronically ill appearing male lying in stretcher, not following commands or answering questions. Neuro: Unable to assess mental status.  Opens eyes but does not follow commands.  Withdraws to pain. HEENT: [place]/AT. PERRL, sclerae anicteric.  MM dry. Cardiovascular: Irregularly irregular, tachycardic. Lungs: Decreased air movement, no W/R/R. GI:  BS x 4, soft, NT/ND.  Musculoskeletal: Pressure boots in place bilaterally.  Muscle wasting noted. Skin: Warm and dry, scattered excoriations/scabs.   LABS:   BMP  Recent Labs Lab 02/10/14 1315  NA 155*  K 5.0  CL 116*  CO2 17*  GLUCOSE 279*  BUN 93*  CREATININE 2.77*  CALCIUM 8.6   CBC  Recent Labs Lab 02/10/14 1315  HGB 14.4  HCT 43.1  WBC 15.1*  PLT 257   Lactic Acid: 3.24  Glucose  Recent Labs Lab 02/10/14 1305  GLUCAP 282*    Imaging Dg Chest Port 1 View  02/10/2014   CLINICAL DATA:  Altered mental status  EXAM: PORTABLE CHEST - 1 VIEW  COMPARISON:  04/03/2013  FINDINGS: Cardiomediastinal silhouette is stable. No acute infiltrate or pleural effusion. No pulmonary edema. Mild degenerative changes thoracic spine.  IMPRESSION: No active disease.   Electronically Signed   By: Lahoma Crocker M.D.   On: 02/10/2014 13:49     ASSESSMENT / PLAN:  PULMONARY A: Hypoxia - chronically O2 dependent. P:   - Maintain SpO2 > 93%. - CXR in AM.  CARDIOVASCULAR A:  AFRVR -  Shock - hypovolemic + cardiogenic (AFRVR) + possible septic HTN HLD P:  - Amiodarone load/infusion. - Metoprolol PRN to maintain for HR > 120. - Fluid resuscitation and phenylephrine, MAP goal > 65.  RENAL A:  AKI - in the setting of shock/hypoperfusion. Hypernatremia  Lactic Acidosis P:   - Fluid resuscitation with 1/2NS,  calculated free water deficit of 4.2L. - Strict I/O's - Trend lactate.  GASTROINTESTINAL A: Transaminitis - presumed in the setting of shock. NPO. P:   - No role SUP. - Reassess diet/nutrition in AM.  HEMATOLOGIC A:  No known issues. P:  - Heparin/SCD's for DVT prophylaxis.  INFECTIOUS A:  Leukocytosis SIRS, possible severe sepsis - no obvious source except sacral decub ulcer P:   - EGDT, goal MAP > 65. - Antibiotics as ordered. - Procalcitonin algorithm. - Repeat lactate in AM. - Monitor fever curve/WBC's. WOC to see for sacral decub  ENDOCRINE A: DM 2 P:   - D/c Metformin. - SSI.  NEUROLOGIC A: Severe baseline dementia Severe baseline debilitation Acute encephalopathy/hypoactive delirium P:   - Monitor.  I spoke with pt's sister  who is closest living relative. Pt is a SNF resident with advanced dementia and severe baseline debilitation. His sister understands the critical nature of his illness and that his likelihood of a good outcome from after intubation and/or ACLS would be essentially nil. On this basis, we have made him DNR/DNI. He is not a candidate for HD of any sort or duration. Care to be limited to IVFs, abx, antiarrhythmics in non-ACLS setting, vasopressors.  He needs to have DNR papers completed at time of discharge if he survives this acute illness  Montey Hora, [place] Pgr: [phone]  or [phone]    I have personally obtained a history, examined the patient, evaluated laboratory and imaging results, formulated the assessment and plan and placed orders. CRITICAL CARE: The patient is critically ill with multiple organ systems failure and requires high complexity decision making for assessment and support, frequent evaluation and titration of therapies, application of advanced monitoring technologies and extensive interpretation of multiple databases. Critical Care Time devoted to patient care services  described in this note is 40 minutes.   Merton Border, MD ; [facility] [phone].  After 5:30 PM or weekends, call [phone]  Pulmonary and [place] Pager: [phone]  02/10/2014, 2:55 PM

## 2014-02-10 NOTE — ED Notes (Signed)
Critical care at bedside  

## 2014-02-10 NOTE — Progress Notes (Addendum)
ANTIBIOTIC CONSULT NOTE - INITIAL  Pharmacy Consult:  Vancomycin / Zosyn Indication:  Sepsis  No Known Allergies  Patient Measurements: Height: 5\' 3"  (160 cm) Weight: 150 lb (68.04 kg) IBW/kg (Calculated) : 56.9  Vital Signs: Temp: 102.7 F (39.3 C) (02/24 1303) Temp src: Rectal (02/24 1303) BP: 80/61 mmHg (02/24 1310)  Labs: No results found for this basename: WBC, HGB, PLT, LABCREA, CREATININE,  in the last 72 hours Estimated Creatinine Clearance: 66.2 ml/min (by C-G formula based on Cr of 0.6). No results found for this basename: VANCOTROUGH, VANCOPEAK, VANCORANDOM, GENTTROUGH, GENTPEAK, GENTRANDOM, TOBRATROUGH, TOBRAPEAK, TOBRARND, AMIKACINPEAK, AMIKACINTROU, AMIKACIN,  in the last 72 hours   Microbiology: No results found for this or any previous visit (from the past 720 hour(s)).  Medical History: Past Medical History  Diagnosis Date  . Stroke 02/2013  . Diabetes mellitus without complication   . Hypertension   . Dementia   . Asthma   . 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 History of prostate cancer     in Remission   . Chronic back pain       Assessment: 78 YOM with history of recent UTI treated with ampicillin presented today 02/10/14 with hypotensive, respiratory distress, and fever.  Pharmacy consulted to manage vancomycin and Zosyn for sepsis.  First doses of antibiotics already ordered.  Labs pending collection.  Vanc 2/24 >> Zosyn 2/24 >>  Goal of Therapy:  Vancomycin trough level 15-20 mcg/ml  Plan:  - Vanc 1gm IV x 1 and Zosyn 3.375gm IV x 1 as ordered - F/U labs for further dosing  Thuy D. Mina Marble, PharmD, BCPS Pager:  [phone] 02/10/2014, 2:40 PM  Addendum: Patient is currently with acute renal failure, with creatinine of 2.7 (0.6 on 03/2013) and an estimated clearance of 23ml/min.  We will continue IV antibiotics and adjust as renal function improves.  Plan: 1.  Zosyn 2.25 gm every 8 hours 2.  Vancomycin 1 gm every 48  hours 3.  Monitor renal function, culture data and clinical response.  Rober Minion, PharmD., MS Clinical Pharmacist Pager:  [phone] Thank you for allowing pharmacy to be part of this patients care team.

## 2014-02-10 NOTE — ED Notes (Signed)
Called x-ray sending portable x-ray now to verify placement.

## 2014-02-10 NOTE — ED Notes (Signed)
Unable to transfer amiodarone drip in MAR.

## 2014-02-10 NOTE — ED Notes (Signed)
Patient arrived with IV left forearm inserted by Nursing home per EMS. Infiltrated left forearm removed elevated left arm.  Multiple IV attempts by 2 Nurses, Charge Nurse, and EMT Obtain one IV access left hand Doctor notified.

## 2014-02-10 NOTE — ED Notes (Signed)
Phlebotomy at bedside attempting blood draw

## 2014-02-10 NOTE — ED Notes (Signed)
EMS called from [facility] for lethargic. Low blood pressure 74/48 and low pulse oximetry 86% 3LNC.  Patient lethargic upon arrival responds to pain.

## 2014-02-10 NOTE — ED Notes (Signed)
EKG completed ordered by critical care

## 2014-02-10 NOTE — ED Provider Notes (Addendum)
CSN: [id number]     Arrival date & time 02/10/14  1257 History   First MD Initiated Contact with Patient 02/10/14 1313     Chief Complaint  Patient presents with  . Hypotension     (Consider location/radiation/quality/duration/timing/severity/associated sxs/prior Treatment) HPI A LEVEL 5 CAVEAT PERTAINS DUE TO DEMENTIA, ALTERED MENTAL STATUS, URGENT NEED FOR INTERVENTION Pt presenting with decreased responsiveness, reported to be hypotensive.  Pt found to be febrile on arrival and appears dehydrated. Per NH paperwork patient is a full code  Past Medical History  Diagnosis Date  . Stroke 02/2013  . Diabetes mellitus without complication   . Hypertension   . Dementia   . Asthma   . 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 History of prostate cancer     in Remission   . Chronic back pain    History reviewed. No pertinent past surgical history. No family history on file. History  Substance Use Topics  . Smoking status: Never Smoker   . Smokeless tobacco: Not on file  . Alcohol Use: No    Review of Systems UNABLE TO OBTAIN ROS DUE TO LEVEL 5 CAVEAT    Allergies  Review of patient's allergies indicates no known allergies.  Home Medications   Current Outpatient Rx  Name  Route  Sig  Dispense  Refill  . acetaminophen (TYLENOL) 325 MG tablet   Oral   Take 650 mg by mouth 3 (three) times daily.         Marland Kitchen ampicillin (PRINCIPEN) 500 MG capsule   Oral   Take 500 mg by mouth 3 (three) times daily. Started 02/05/14, for 10 days, ending 02/13/14         . aspirin EC 81 MG EC tablet   Oral   Take 1 tablet (81 mg total) by mouth daily.         Marland Kitchen atorvastatin (LIPITOR) 20 MG tablet   Oral   Take 20 mg by mouth daily at 6 PM.         . clopidogrel (PLAVIX) 75 MG tablet   Oral   Take 75 mg by mouth daily.         . collagenase (SANTYL) ointment   Topical   Apply 1 application topically daily.         Marland Kitchen donepezil (ARICEPT) 10 MG tablet   Oral  Take 1 tablet (10 mg total) by mouth at bedtime.   30 tablet   0   . Menthol, Topical Analgesic, (BIOFREEZE) 4 % GEL   Apply externally   Apply 1 application topically 3 (three) times daily. For left knee         . metFORMIN (GLUCOPHAGE) 500 MG tablet   Oral   Take 500 mg by mouth 2 (two) times daily with a meal.         . metoprolol tartrate (LOPRESSOR) 25 MG tablet   Oral   Take 25 mg by mouth 2 (two) times daily.         Marland Kitchen saccharomyces boulardii (FLORASTOR) 250 MG capsule   Oral   Take 250 mg by mouth 2 (two) times daily. Started 02/04/14, for 14 days ending 02/22/14         . saxagliptin HCl (ONGLYZA) 2.5 MG TABS tablet   Oral   Take 2.5 mg by mouth daily.         . tamsulosin (FLOMAX) 0.4 MG CAPS   Oral   Take 0.4 mg by mouth daily after supper.          Marland Kitchen  valsartan (DIOVAN) 160 MG tablet   Oral   Take 320 mg by mouth daily.          Marland Kitchen albuterol (PROVENTIL HFA;VENTOLIN HFA) 108 (90 BASE) MCG/ACT inhaler   Inhalation   Inhale 2 puffs into the lungs every 6 (six) hours as needed for wheezing.          BP 100/49  Pulse 42  Temp(Src) 99.1 F (37.3 C) (Rectal)  Resp 23  Ht 5\' 3"  (1.6 m)  Wt 150 lb (68.04 kg)  BMI 26.58 kg/m2  SpO2 100% Vitals reviewed Physical Exam Physical Examination: General appearance - alert, decreased responsiveness Mental status - alert, not able to assess orientation Eyes - pupils equal and reactive, extraocular eye movements intact Mouth - mucous membranes moist, pharynx normal without lesions Neck - supple, no significant adenopathy Chest - clear to auscultation, no wheezes, rales or rhonchi, symmetric air entry Heart - rapid rate, irregular rhythm, normal S1, S2, no murmurs, rubs, clicks or gallops Abdomen - soft, nontender, nondistended, no masses or organomegaly Extremities - peripheral pulses normal, no pedal edema, no clubbing or cyanosis Skin - normal coloration, decreased skin turgor  ED Course  Procedures  (including critical care time)  1:56 PM d/w PCCM, Dr. Villa Herb, abx ordered, IV fluids starting, will continue with 2L bolus.  PCCM will come to see the patient.     Date: 02/10/2014  Rate: 159  Rhythm: atrial fibrillation  QRS Axis: normal  Intervals: indeterminate  ST/T Wave abnormalities: nonspecific ST/T changes  Conduction Disutrbances:none  Narrative Interpretation:   Old EKG Reviewed: none available EKG not available in epic for documentation in [place] Reviewed  CBC - Abnormal; Notable for the following:    WBC 15.1 (*)    All other components within normal limits  COMPREHENSIVE METABOLIC PANEL - Abnormal; Notable for the following:    Sodium 155 (*)    Chloride 116 (*)    CO2 17 (*)    Glucose, Bld 279 (*)    BUN 93 (*)    Creatinine, Ser 2.77 (*)    Albumin 2.4 (*)    AST 555 (*)    ALT 285 (*)    Total Bilirubin 1.6 (*)    GFR calc non Af Amer 21 (*)    GFR calc Af Amer 25 (*)    All other components within normal limits  URINALYSIS, ROUTINE W REFLEX MICROSCOPIC - Abnormal; Notable for the following:    Color, Urine AMBER (*)    Glucose, UA >1000 (*)    Hgb urine dipstick LARGE (*)    Bilirubin Urine SMALL (*)    Protein, ur 30 (*)    Urobilinogen, UA 2.0 (*)    All other components within normal limits  URINE MICROSCOPIC-ADD ON - Abnormal; Notable for the following:    Bacteria, UA FEW (*)    All other components within normal limits  CBG MONITORING, ED - Abnormal; Notable for the following:    Glucose-Capillary 282 (*)    All other components within normal limits  I-STAT CG4 LACTIC ACID, ED - Abnormal; Notable for the following:    Lactic Acid, Venous 3.24 (*)    All other components within normal limits  CULTURE, BLOOD (ROUTINE X 2)  CULTURE, BLOOD (ROUTINE X 2)  URINE CULTURE  TROPONIN I  PROCALCITONIN  CBC  CREATININE, SERUM  CBG MONITORING, ED   Imaging Review Dg Chest Port 1 View  02/10/2014   CLINICAL DATA:  Altered mental  status  EXAM: PORTABLE CHEST - 1 VIEW  COMPARISON:  04/03/2013  FINDINGS: Cardiomediastinal silhouette is stable. No acute infiltrate or pleural effusion. No pulmonary edema. Mild degenerative changes thoracic spine.  IMPRESSION: No active disease.   Electronically Signed   By: Lahoma Crocker M.D.   On: 02/10/2014 13:49    EKG Interpretation   None     CRITICAL CARE Performed by: Threasa Beards Total critical care time: 40 Critical care time was exclusive of separately billable procedures and treating other patients. Critical care was necessary to treat or prevent imminent or life-threatening deterioration. Critical care was time spent personally by me on the following activities: development of treatment plan with patient and/or surrogate as well as nursing, discussions with consultants, evaluation of patient's response to treatment, examination of patient, obtaining history from patient or surrogate, ordering and performing treatments and interventions, ordering and review of laboratory studies, ordering and review of radiographic studies, pulse oximetry and re-evaluation of patient's condition.  MDM   Final diagnoses:  Septic shock  Febrile illness  Dehydration  Atrial fibrillation, rapid  Leukocytosis  Hypernatremia  Transaminitis  Renal failure    Pt presenting with decreased mental status, hypotension, fever, rapid afib- no history known of afib per paperwork.  Pt with mild improvement in BP with fluids, pt with elevated WBC, lactate of 3, transaminitis, renal failure, hypernatremia.  Pt in septic shock, source unknown at this time.  Started on fluid resuscitation, broad spectrum antibiotics, and pt to be admitted to PCCM/ICU.  They have seen him in the ED.     Threasa Beards, MD 02/10/14 1640  Threasa Beards, MD 02/10/14 [phone]

## 2014-02-10 NOTE — Procedures (Signed)
Central Venous Catheter Insertion Procedure Note Jerry Stanley [id number] [birth year]  Procedure: Insertion of Central Venous Catheter Indications: Assessment of intravascular volume, Drug and/or fluid administration and Frequent blood sampling  Procedure Details Consent: Risks of procedure as well as the alternatives and risks of each were explained to the (patient/caregiver).  Consent for procedure obtained. Time Out: Verified patient identification, verified procedure, site/side was marked, verified correct patient position, special equipment/implants available, medications/allergies/relevent history reviewed, required imaging and test results available.  Performed  Maximum sterile technique was used including antiseptics, cap, gloves, gown, hand hygiene, mask and sheet. Skin prep: Chlorhexidine; local anesthetic administered A antimicrobial bonded/coated triple lumen catheter was placed in the right internal jugular vein using the Seldinger technique.  Evaluation Blood flow good Complications: No apparent complications Patient did tolerate procedure well. Chest X-ray ordered to verify placement.  CXR: pending.   Montey Hora, PA - C [place] [facility] Pgr: [phone]  or [phone]   I was present for and supervised the entire procedure  Merton Border, MD ; [facility] service Mobile [phone].  After 5:30 PM or weekends, call [phone]

## 2014-02-10 NOTE — ED Notes (Signed)
Critical Care in ED notified of patient's I-stat lactic acid 3.24.

## 2014-02-11 ENCOUNTER — Inpatient Hospital Stay (HOSPITAL_COMMUNITY): Payer: Medicare Other

## 2014-02-11 DIAGNOSIS — N19 Unspecified kidney failure: Secondary | ICD-10-CM

## 2014-02-11 DIAGNOSIS — R579 Shock, unspecified: Secondary | ICD-10-CM

## 2014-02-11 DIAGNOSIS — A419 Sepsis, unspecified organism: Secondary | ICD-10-CM | POA: Insufficient documentation

## 2014-02-11 DIAGNOSIS — R6521 Severe sepsis with septic shock: Secondary | ICD-10-CM

## 2014-02-11 DIAGNOSIS — E43 Unspecified severe protein-calorie malnutrition: Secondary | ICD-10-CM

## 2014-02-11 DIAGNOSIS — I4891 Unspecified atrial fibrillation: Secondary | ICD-10-CM | POA: Diagnosis present

## 2014-02-11 LAB — GLUCOSE, CAPILLARY
GLUCOSE-CAPILLARY: 134 mg/dL — AB (ref 70–99)
Glucose-Capillary: 107 mg/dL — ABNORMAL HIGH (ref 70–99)
Glucose-Capillary: 138 mg/dL — ABNORMAL HIGH (ref 70–99)
Glucose-Capillary: 139 mg/dL — ABNORMAL HIGH (ref 70–99)
Glucose-Capillary: 172 mg/dL — ABNORMAL HIGH (ref 70–99)
Glucose-Capillary: 174 mg/dL — ABNORMAL HIGH (ref 70–99)

## 2014-02-11 LAB — BASIC METABOLIC PANEL
BUN: 54 mg/dL — ABNORMAL HIGH (ref 6–23)
BUN: 45 mg/dL — ABNORMAL HIGH (ref 6–23)
BUN: 33 mg/dL — ABNORMAL HIGH (ref 6–23)
CALCIUM: 7.1 mg/dL — AB (ref 8.4–10.5)
CHLORIDE: 112 meq/L (ref 96–112)
CO2: 18 mEq/L — ABNORMAL LOW (ref 19–32)
CO2: 19 mEq/L (ref 19–32)
CO2: 20 mEq/L (ref 19–32)
CREATININE: 1.42 mg/dL — AB (ref 0.50–1.35)
Calcium: 7.3 mg/dL — ABNORMAL LOW (ref 8.4–10.5)
Calcium: 7.3 mg/dL — ABNORMAL LOW (ref 8.4–10.5)
Chloride: 124 mEq/L — ABNORMAL HIGH (ref 96–112)
Chloride: 120 mEq/L — ABNORMAL HIGH (ref 96–112)
Creatinine, Ser: 1.17 mg/dL (ref 0.50–1.35)
Creatinine, Ser: 1.06 mg/dL (ref 0.50–1.35)
GFR calc Af Amer: 55 mL/min — ABNORMAL LOW (ref >90)
GFR calc non Af Amer: 60 mL/min — ABNORMAL LOW (ref >90)
GFR, EST AFRICAN AMERICAN: 70 mL/min — AB (ref >90)
GFR, EST AFRICAN AMERICAN: 78 mL/min — AB (ref >90)
GFR, EST NON AFRICAN AMERICAN: 47 mL/min — AB (ref >90)
GFR, EST NON AFRICAN AMERICAN: 68 mL/min — AB (ref >90)
GLUCOSE: 138 mg/dL — AB (ref 70–99)
Glucose, Bld: 161 mg/dL — ABNORMAL HIGH (ref 70–99)
Glucose, Bld: 210 mg/dL — ABNORMAL HIGH (ref 70–99)
POTASSIUM: 4 meq/L (ref 3.7–5.3)
Potassium: 3.6 mEq/L — ABNORMAL LOW (ref 3.7–5.3)
Potassium: 3.6 mEq/L — ABNORMAL LOW (ref 3.7–5.3)
SODIUM: 153 meq/L — AB (ref 137–147)
SODIUM: 146 meq/L (ref 137–147)
Sodium: 154 mEq/L — ABNORMAL HIGH (ref 137–147)

## 2014-02-11 LAB — CORTISOL: Cortisol, Plasma: 25.1 ug/dL

## 2014-02-11 LAB — PHOSPHORUS: Phosphorus: 2.5 mg/dL (ref 2.3–4.6)

## 2014-02-11 LAB — LACTIC ACID, PLASMA: Lactic Acid, Venous: 1.1 mmol/L (ref 0.5–2.2)

## 2014-02-11 LAB — MAGNESIUM: Magnesium: 1.7 mg/dL (ref 1.5–2.5)

## 2014-02-11 LAB — CLOSTRIDIUM DIFFICILE BY PCR: CDIFFPCR: NEGATIVE

## 2014-02-11 LAB — PROCALCITONIN: Procalcitonin: 0.96 ng/mL

## 2014-02-11 MED ORDER — PIPERACILLIN-TAZOBACTAM 3.375 G IVPB
3.3750 g | Freq: Three times a day (TID) | INTRAVENOUS | Status: DC
  Administered 2014-02-11 – 2014-02-12 (×3): 3.375 g via INTRAVENOUS
  Filled 2014-02-11 (×6): qty 50

## 2014-02-11 MED ORDER — MAGNESIUM SULFATE 40 MG/ML IJ SOLN: 2.0000 g | Freq: Once | INTRAMUSCULAR | Status: DC

## 2014-02-11 MED ORDER — ATORVASTATIN CALCIUM 20 MG PO TABS
20.0000 mg | ORAL_TABLET | Freq: Every day | ORAL | Status: DC
  Administered 2014-02-11 – 2014-02-16 (×6): 20 mg via ORAL
  Filled 2014-02-11 (×7): qty 1

## 2014-02-11 MED ORDER — SODIUM CHLORIDE 0.9 % IJ SOLN
10.0000 mL | INTRAMUSCULAR | Status: DC | PRN
  Administered 2014-02-11 – 2014-02-15 (×8): 10 mL
  Administered 2014-02-15: 20 mL
  Administered 2014-02-15: 10 mL
  Administered 2014-02-16 (×2): 20 mL
  Administered 2014-02-17 (×2): 10 mL

## 2014-02-11 MED ORDER — VANCOMYCIN HCL IN DEXTROSE 1-5 GM/200ML-% IV SOLN
1000.0000 mg | INTRAVENOUS | Status: DC
  Administered 2014-02-11: 1000 mg via INTRAVENOUS
  Filled 2014-02-11 (×2): qty 200

## 2014-02-11 MED ORDER — DONEPEZIL HCL 10 MG PO TABS
10.0000 mg | ORAL_TABLET | Freq: Every day | ORAL | Status: DC
  Administered 2014-02-11 – 2014-02-16 (×6): 10 mg via ORAL
  Filled 2014-02-11 (×7): qty 1

## 2014-02-11 MED ORDER — ASPIRIN EC 81 MG PO TBEC
81.0000 mg | DELAYED_RELEASE_TABLET | Freq: Every day | ORAL | Status: DC
  Administered 2014-02-11 – 2014-02-17 (×7): 81 mg via ORAL
  Filled 2014-02-11 (×7): qty 1

## 2014-02-11 MED ORDER — CLOPIDOGREL BISULFATE 75 MG PO TABS
75.0000 mg | ORAL_TABLET | Freq: Every day | ORAL | Status: DC
  Administered 2014-02-11 – 2014-02-16 (×6): 75 mg via ORAL
  Filled 2014-02-11 (×7): qty 1

## 2014-02-11 MED ORDER — POTASSIUM CHLORIDE 10 MEQ/50ML IV SOLN
10.0000 meq | INTRAVENOUS | Status: AC
  Administered 2014-02-11 (×3): 10 meq via INTRAVENOUS
  Filled 2014-02-11: qty 50

## 2014-02-11 MED ORDER — DEXTROSE 5 % IV SOLN
INTRAVENOUS | Status: DC
  Administered 2014-02-11: 50 mL via INTRAVENOUS

## 2014-02-11 MED ORDER — COLLAGENASE 250 UNIT/GM EX OINT
TOPICAL_OINTMENT | Freq: Every day | CUTANEOUS | Status: DC
  Administered 2014-02-11 – 2014-02-16 (×6): via TOPICAL
  Filled 2014-02-11: qty 30

## 2014-02-11 NOTE — Procedures (Signed)
Personally supervised central line procedure. Real time 2D ultrasound used for vein site selection, patency assessment, and needle entry.  A record of image was made but could not be submitted for filing due to malfunction of printing device  Dr. Brand Males, M.D., [facility].C.P Pulmonary and Critical Care Medicine Staff Physician [place] [facility] Pager: [phone], If no answer or between  15:00h - 7:00h: call [phone]  02/11/2014 3:43 AM

## 2014-02-11 NOTE — Progress Notes (Signed)
INITIAL NUTRITION ASSESSMENT  DOCUMENTATION CODES Per approved criteria  -Severe malnutrition in the context of chronic illness   INTERVENTION:  Diet advancement per MD, recommend regular diet if no swallowing difficulties suspected.  Once diet advanced, recommend Ensure Complete po TID, each supplement provides 350 kcal and 13 grams of protein.  NUTRITION DIAGNOSIS: Increased nutrient needs related to multiple pressure ulcers as evidenced by estimated nutrition needs.   Goal: Intake to meet >90% of estimated nutrition needs.  Monitor:  Diet advancement, PO intake, labs, weight trend.  Reason for Assessment: MST; weight loss  73 y.o. male  Admitting Dx: Shock; Hypotension  ASSESSMENT: Patient is a 73 y.o. M from [place], brought in by EMS for hypotension, progressive lethargy over the past 2 weeks, and fever to 103 day of admit. Initial BP in ER in 08'M systolic.   Patient unable to provide any nutrition history during RD visit. Patient appears thin.  Discussed patient in ICU rounds today. Patient with dementia.  Nutrition Focused Physical Exam:  Subcutaneous Fat:  Orbital Region: moderate-severe depletion Upper Arm Region: WNL Thoracic and Lumbar Region: NA  Muscle:  Temple Region: severe depletion Clavicle Bone Region: moderate-severe depletion Clavicle and Acromion Bone Region: moderate-severe depletion Scapular Bone Region: NA Dorsal Hand: WNL Patellar Region: WNL Anterior Thigh Region: WNL Posterior Calf Region: WNL  Edema: none  Pt meets criteria for severe MALNUTRITION in the context of chronic illness as evidenced by severe depletion of muscle mass and 11% weight loss within 6 months.   Height: Ht Readings from Last 1 Encounters:  02/11/14 5\' 7"  (1.702 m)    Weight: Wt Readings from Last 1 Encounters:  02/11/14 143 lb 1.3 oz (64.9 kg)    Ideal Body Weight: 67.3 kg  % Ideal Body Weight: 96%  Wt Readings from Last 10 Encounters:  02/11/14  143 lb 1.3 oz (64.9 kg)  09/03/13 161 lb 3.2 oz (73.12 kg)  08/05/13 161 lb 3.2 oz (73.12 kg)  05/06/13 156 lb (70.761 kg)  04/03/13 154 lb 15.7 oz (70.3 kg)    Usual Body Weight: 161 lb  % Usual Body Weight: 89%  BMI:  Body mass index is 22.4 kg/(m^2).  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  Diet Order: NPO  EDUCATION NEEDS: -Education not appropriate at this time   Intake/Output Summary (Last 24 hours) at 02/11/14 1149 Last data filed at 02/11/14 0833  Gross per 24 hour  Intake 1963.91 ml  Output   2850 ml  Net -886.09 ml    Last BM: 2/25   Labs:   Recent Labs Lab 02/10/14 1315 02/10/14 1721 02/11/14 0500  NA 155*  --  154*  K 5.0  --  3.6*  CL 116*  --  124*  CO2 17*  --  18*  BUN 93*  --  54*  CREATININE 2.77* 2.41* 1.42*  CALCIUM 8.6  --  7.1*  MG  --   --  1.7  PHOS  --   --  2.5  GLUCOSE 279*  --  138*    CBG (last 3)   Recent Labs  02/10/14 2333 02/11/14 0339 02/11/14 0741  GLUCAP 139* 107* 134*    Scheduled Meds: . aspirin EC  81 mg Oral Daily  . atorvastatin  20 mg Oral q1800  . clopidogrel  75 mg Oral Q breakfast  .  collagenase   Topical Daily  . donepezil  10 mg Oral QHS  . heparin  5,000 Units Subcutaneous 3 times per day  . insulin aspart  0-9 Units Subcutaneous 6 times per day  . piperacillin-tazobactam (ZOSYN)  IV  3.375 g Intravenous Q8H  . vancomycin  1,000 mg Intravenous Q24H    Continuous Infusions: . dextrose 50 mL (02/11/14 4235)    Past Medical History  Diagnosis Date  . Stroke 02/2013  . Diabetes mellitus without complication   . Hypertension   . Dementia   . Asthma   . 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 History of prostate cancer     in Remission   . Chronic back pain     History  reviewed. No pertinent past surgical history.   Molli Barrows, RD, LDN, [place] Pager [phone] After Hours Pager [phone]

## 2014-02-11 NOTE — Procedures (Signed)
Arterial Catheter Insertion Procedure Note Callan Norden [id number] [birth year]  Procedure: Insertion of Arterial Catheter  Indications: Blood pressure monitoring  Procedure Details Consent: Unable to obtain consent because of altered level of consciousness. Time Out: Verified patient identification, verified procedure, site/side was marked, verified correct patient position, special equipment/implants available, medications/allergies/relevent history reviewed, required imaging and test results available.  Performed  Maximum sterile technique was used including antiseptics, cap, gloves, gown, hand hygiene, mask and sheet. Skin prep: Chlorhexidine; local anesthetic administered 20 gauge catheter was inserted into right radial artery using the Seldinger technique.  Evaluation Blood flow good; BP tracing good. Complications: No apparent complications.   Philomena Doheny 02/11/2014

## 2014-02-11 NOTE — Procedures (Signed)
Central Venous Catheter Insertion Procedure Note Jerry Stanley [id number] 15-Jan-1941  Procedure: Insertion of Central Venous Catheter Indications: Drug and/or fluid administration  Procedure Details Consent: Risks of procedure as well as the alternatives and risks of each were explained to the (patient/caregiver).  Consent for procedure obtained. and Unable to obtain consent because of altered level of consciousness. Time Out: Verified patient identification, verified procedure, site/side was marked, verified correct patient position, special equipment/implants available, medications/allergies/relevent history reviewed, required imaging and test results available.  Performed Real time [place] used to ID and cannulate vessel  Maximum sterile technique was used including antiseptics, cap, gloves, gown, hand hygiene, mask and sheet. Skin prep: Chlorhexidine; local anesthetic administered A antimicrobial bonded/coated triple lumen catheter was placed in the left internal jugular vein using the Seldinger technique.  Evaluation Blood flow good Complications: No apparent complications Patient did tolerate procedure well. Chest X-ray ordered to verify placement.  CXR: pending.  Jerry Stanley,PETE 02/11/2014, 3:33 AM

## 2014-02-11 NOTE — Progress Notes (Addendum)
Name: Jerry Stanley MRN: [id number] DOB: 07-25-1941    ADMISSION DATE:  02/10/2014 CONSULTATION DATE:  02/10/2014  REFERRING MD :  EDP PRIMARY SERVICE: PCCM  CHIEF COMPLAINT:  hypotension  BRIEF PATIENT DESCRIPTION: 73 y.o. M from [place], brought in by EMS for hypotension, progressive lethargy over the past 2 weeks, and fever to 103 day of admit. Initial BP in ER in 60's systolic. PCCM asked to admit. PMH - dementia, DM-2, HOCM  SIGNIFICANT EVENTS / STUDIES:  2/24 - admitted from [place].  LINES / TUBES: 2/24 RIJ CVL >>> 2/24 R rad art >>>  CULTURES: Blood x 2 2/24 >>>  Urine 2/24 >>> C diff 2/25 >>>  ANTIBIOTICS: Zosyn 2/24 >>>  Vanc 2/24 >>>  SUBJECTIVE: febrile to 102.7 overnight. Diarrhea x3. More alert.  VITAL SIGNS: Temp:  [95.5 F (35.3 C)-102.7 F (39.3 C)] 97.9 F (36.6 C) (02/25 0700) Pulse Rate:  [42-152] 55 (02/25 0700) Resp:  [15-27] 15 (02/25 0700) BP: (62-123)/(32-105) 101/45 mmHg (02/25 0300) SpO2:  [86 %-100 %] 100 % (02/25 0700) Arterial Line BP: (98-131)/(47-61) 105/48 mmHg (02/25 0700) Weight:  [64.9 kg (143 lb 1.3 oz)-68.04 kg (150 lb)] 64.9 kg (143 lb 1.3 oz) (02/25 0500) HEMODYNAMICS: CVP:  [1 mmHg-9 mmHg] 9 mmHg VENTILATOR SETTINGS:   INTAKE / OUTPUT: Intake/Output     02/24 0701 - 02/25 0700 02/25 0701 - 02/26 0700   I.V. (mL/kg) 1683.9 (25.9)    IV Piggyback 100    Total Intake(mL/kg) 1783.9 (27.5)    Urine (mL/kg/hr) 2500    Total Output 2500     Net -716.1          Stool Occurrence 3 x      PHYSICAL EXAMINATION: General: Chronically ill appearing male lying in bed Neuro: alert, oriented to person, able to answer simple questions HEENT: Halls/AT. Poor dentition Cardiovascular: rrr, no murmurs noted Lungs: CTAB GI: BS x 4, soft, NT/ND.  Musculoskeletal: decreased muscle mass noted Skin: Warm and dry, scattered excoriations/scabs, several pink pads on sacral ulcers  LABS:  CBC  Recent Labs Lab 02/10/14 1315  02/10/14 1721  WBC 15.1* 13.1*  HGB 14.4 14.0  HCT 43.1 42.1  PLT 257 216   Coag's No results found for this basename: APTT, INR,  in the last 168 hours BMET  Recent Labs Lab 02/10/14 1315 02/10/14 1721 02/11/14 0500  NA 155*  --  154*  K 5.0  --  3.6*  CL 116*  --  124*  CO2 17*  --  18*  BUN 93*  --  54*  CREATININE 2.77* 2.41* 1.42*  GLUCOSE 279*  --  138*   Electrolytes  Recent Labs Lab 02/10/14 1315 02/11/14 0500  CALCIUM 8.6 7.1*  MG  --  1.7  PHOS  --  2.5   Sepsis Markers  Recent Labs Lab 02/10/14 1513 02/10/14 1721 02/10/14 1729 02/11/14 0500  LATICACIDVEN 3.24*  --  1.90 1.1  PROCALCITON  --  1.17  --  0.96   ABG No results found for this basename: PHART, PCO2ART, PO2ART,  in the last 168 hours Liver Enzymes  Recent Labs Lab 02/10/14 1315  AST 555*  ALT 285*  ALKPHOS 58  BILITOT 1.6*  ALBUMIN 2.4*   Cardiac Enzymes  Recent Labs Lab 02/10/14 1316  TROPONINI <0.30   Glucose  Recent Labs Lab 02/10/14 1305 02/10/14 1747 02/10/14 2107 02/10/14 2333 02/11/14 0339  GLUCAP 282* 275* 208* 139* 107*    Imaging Dg Chest [facility]  1 View  02/11/2014   CLINICAL DATA:  Central line placement.  EXAM: PORTABLE CHEST - 1 VIEW  COMPARISON:  Chest radiograph performed 02/10/2014  FINDINGS: A left IJ line is seen extending into a small branch vessel off the SVC; this should be retracted 2 cm and repositioned.  The lungs are relatively well expanded and appear clear. No focal consolidation, pleural effusion or pneumothorax is seen.  The cardiomediastinal silhouette is borderline normal in size. No acute osseous abnormalities are identified.  IMPRESSION: 1. Left IJ line seen extending into a small branch vessel off the SVC; this should be retracted 2 cm and repositioned. 2. No acute cardiopulmonary process seen.  These results were called by telephone at the time of interpretation on 02/11/2014 at 4:20 AM to Nursing on [facility] ICU, who verbally acknowledged  these results.   Electronically Signed   By: Garald Balding M.D.   On: 02/11/2014 04:23   Dg Chest Port 1 View  02/10/2014   CLINICAL DATA:  Altered mental status  EXAM: PORTABLE CHEST - 1 VIEW  COMPARISON:  04/03/2013  FINDINGS: Cardiomediastinal silhouette is stable. No acute infiltrate or pleural effusion. No pulmonary edema. Mild degenerative changes thoracic spine.  IMPRESSION: No active disease.   Electronically Signed   By: Lahoma Crocker M.D.   On: 02/10/2014 13:49   Dg Chest Port 1v Same Day  02/10/2014   CLINICAL DATA:  Central line placement. Tachycardia. Hypotension. Fever.  EXAM: PORTABLE CHEST - 1 VIEW SAME DAY  COMPARISON:  Today at 1:36 p.m.  FINDINGS: Right internal jugular central venous catheter tip: Lower SVC. No pneumothorax  Heart size within normal limits for projection. Atherosclerotic aortic arch. The lungs appear clear. Loss of acromiohumeral space on the left suggesting chronic left rotator cuff tear.  IMPRESSION: 1. Right IJ line tip: Lower SVC. No pneumothorax or complicating feature. 2. 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   Electronically Signed   By: Sherryl Barters M.D.   On: 02/10/2014 17:21     CXR: right IJ seen, no infiltrates noted  ASSESSMENT / PLAN:  PULMONARY  A:  Hypoxia - chronically O2 dependent.  P:  - Maintain SpO2 > 90%.    CARDIOVASCULAR  A:  Hypotension - resolved Atrial Fibrillation - reverted with Amiodarone load/infusion  Shock - septic vs hypovolemic vs cardiogenic (AFRVR) HTN  HOCM - rate control & preload vital P:  -  will d/c amio - start lopressor 5 mg q6 prn HR >120, consider starting po lopressor tomorrow - Fluid resuscitation, MAP goal > 65 - pressors off - hold home antiHTN - restarting plavix and aspirin and statin - remove a line   RENAL  A:  AKI - in the setting of shock/hypoperfusion.  Hypernatremia - likely impaired access to free water Lactic Acidosis - improved Hypokalemia P:  - Fluid resuscitation  with D5W 75/hr - Strict I/O's  - replete K  -dc foley  GASTROINTESTINAL  A:  Transaminitis - presumed in the setting of shock.  NPO.  P:  - start carb mod diet - trend LFTs  HEMATOLOGIC  A:  No known issues.  P:  - Heparin/SCD's for DVT prophylaxis.   INFECTIOUS  A:  Leukocytosis  Septic Shock  P:  - goal MAP > 65.  - Antibiotics as ordered.  - Procalcitonin algorithm.  - Monitor fever curve/WBC's. - lactate improved   ENDOCRINE  A:  DM  P:  - D/c Metformin.  - SSI.  -  monitor cbgs  NEUROLOGIC  A:  Delirium hypoactive/AMS - improving Dementia baseline P:  - Monitor.  - restart aricept - PT to eval  TODAY'S SUMMARY: D5W to 75/hr, continue antibiotics, f/u cultures, transfer to tele and triad, amiodarone to d/c and start lopressor. Feel hypovolemia & tachycardia (? Due to occult infection) in setting of HOCM cause hypotension  I have personally obtained a history, examined the patient, evaluated laboratory and imaging results, formulated the assessment and plan and placed orders. CRITICAL CARE: The patient is critically ill with multiple organ systems failure and requires high complexity decision making for assessment and support, frequent evaluation and titration of therapies, application of advanced monitoring technologies and extensive interpretation of multiple databases. Critical Care Time devoted to patient care services described in this note is 32 minutes.   Rigoberto Noel  Pulmonary and [place] Pager: [phone]  02/11/2014, 7:39 AM

## 2014-02-11 NOTE — Clinical Documentation Improvement (Signed)
To PCCM MD's, NP's, and PA's  Dietitian consult on 02/11/14  states "Pt meets criteria for severe MALNUTRITION in the context of chronic illness as evidenced by severe depletion of muscle mass and 11% weight loss within 6 months. " with BMI 22.4.  If this evaluation is appropriate please provide clinical diagnosis.  Thank you   Possible Clinical Conditions?  Severe Malnutrition    Protein Calorie Malnutrition  Severe Protein Calorie Malnutrition  Other Condition  Cannot clinically determine   Risk Factors: sepsis, aki, pressure ulcer to sacral unstageable   Treatment:recommend Ensure Complete po TID, each supplement provides 350 kcal and 13 grams of protein.     Thank You, Ree Kida ,RN Clinical Documentation Specialist:  [employer]

## 2014-02-11 NOTE — Progress Notes (Signed)
ANTIBIOTIC CONSULT NOTE - FOLLOW UP  Pharmacy Consult for vancomycin + zosyn Indication: rule out sepsis  No Known Allergies  Patient Measurements: Height: 5\' 7"  (170.2 cm) Weight: 143 lb 1.3 oz (64.9 kg) IBW/kg (Calculated) : 66.1 Adjusted Body Weight:   Vital Signs: Temp: 97.9 F (36.6 C) (02/25 0800) Temp src: Core (Comment) (02/25 0800) BP: 101/45 mmHg (02/25 0300) Pulse Rate: 55 (02/25 0700) Intake/Output from previous day: 02/24 0701 - 02/25 0700 In: 1783.9 [I.V.:1683.9; IV Piggyback:100] Out: 2500 [Urine:2500] Intake/Output from this shift: Total I/O In: 180 [I.V.:130; IV Piggyback:50] Out: 350 [Urine:350]  Labs:  Recent Labs  02/10/14 1315 02/10/14 1721 02/11/14 0500  WBC 15.1* 13.1*  --   HGB 14.4 14.0  --   PLT 257 216  --   CREATININE 2.77* 2.41* 1.42*   Estimated Creatinine Clearance: 42.5 ml/min (by C-G formula based on Cr of 1.42). No results found for this basename: VANCOTROUGH, VANCOPEAK, VANCORANDOM, [place], [place], [place], [place], [place], TOBRARND, AMIKACINPEAK, AMIKACINTROU, AMIKACIN,  in the last 72 hours   Microbiology: Recent Results (from the past 720 hour(s))  CULTURE, BLOOD (ROUTINE X 2)     Status: None   Collection Time    02/10/14  2:40 PM      Result Value Ref Range Status   Specimen Description BLOOD ARM LEFT   Final   Special Requests BOTTLES DRAWN AEROBIC AND ANAEROBIC [place]   Final   Culture  Setup Time     Final   Value: 02/10/2014 22:10     Performed at [facility]   Culture     Final   Value:        BLOOD CULTURE RECEIVED NO GROWTH TO DATE CULTURE WILL BE HELD FOR 5 DAYS BEFORE ISSUING A FINAL NEGATIVE REPORT     Performed at [facility]   Report Status PENDING   Incomplete  CULTURE, BLOOD (ROUTINE X 2)     Status: None   Collection Time    02/10/14  5:21 PM      Result Value Ref Range Status   Specimen Description BLOOD HAND LEFT   Final   Special Requests BOTTLES DRAWN AEROBIC AND  ANAEROBIC B 4CC R 3CC   Final   Culture  Setup Time     Final   Value: 02/10/2014 22:10     Performed at [facility]   Culture     Final   Value:        BLOOD CULTURE RECEIVED NO GROWTH TO DATE CULTURE WILL BE HELD FOR 5 DAYS BEFORE ISSUING A FINAL NEGATIVE REPORT     Performed at [facility]   Report Status PENDING   Incomplete  MRSA PCR SCREENING     Status: None   Collection Time    02/10/14  5:50 PM      Result Value Ref Range Status   MRSA by PCR NEGATIVE  NEGATIVE Final   Comment:            The GeneXpert MRSA Assay (FDA     approved for NASAL specimens     only), is one component of a     comprehensive MRSA colonization     surveillance program. It is not     intended to diagnose MRSA     infection nor to guide or     monitor treatment for     MRSA infections.    Anti-infectives   Start     Dose/Rate Route  Frequency Ordered Stop   02/12/14 1400  vancomycin (VANCOCIN) IVPB 1000 mg/200 mL premix  Status:  Discontinued     1,000 mg 200 mL/hr over 60 Minutes Intravenous Every 48 hours 02/10/14 1612 02/11/14 1024   02/11/14 1500  vancomycin (VANCOCIN) IVPB 1000 mg/200 mL premix     1,000 mg 200 mL/hr over 60 Minutes Intravenous Every 24 hours 02/11/14 1024     02/11/14 1200  piperacillin-tazobactam (ZOSYN) IVPB 3.375 g     3.375 g 12.5 mL/hr over 240 Minutes Intravenous Every 8 hours 02/11/14 1023     02/10/14 2300  piperacillin-tazobactam (ZOSYN) IVPB 2.25 g  Status:  Discontinued     2.25 g 100 mL/hr over 30 Minutes Intravenous 3 times per day 02/10/14 1612 02/11/14 1022   02/10/14 1330  piperacillin-tazobactam (ZOSYN) IVPB 3.375 g     3.375 g 100 mL/hr over 30 Minutes Intravenous  Once 02/10/14 1329 02/10/14 1521   02/10/14 1330  vancomycin (VANCOCIN) IVPB 1000 mg/200 mL premix     1,000 mg 200 mL/hr over 60 Minutes Intravenous  Once 02/10/14 1329 02/10/14 1630      Assessment: 72 yom with a hx of recent UTI treated with ampicillin presented  with hypotension, respiratory distress and fever. Also noted to have AKI. Started empirically on vancomycin + zosyn. Renal function is rapidly improving with hydration. Scr is down to 1.42. Tmax is 102.7 and WBC is 13.1.   Vanc 2/24>> Zosyn 2/24>>  2/24 Blood - NGTD 2/24 MRSA - NEG 2/24 Cdiff - pending  Goal of Therapy:  Vancomycin trough level 15-20 mcg/ml  Plan:  1. Change vancomycin to 1gm IV Q24H 2. Change zosyn to 3.375gm IV Q8H (4 hr inf) 3. F/u renal fxn, C&S, clinical status and trough at [facility], Rande Lawman 02/11/2014,12:07 PM

## 2014-02-11 NOTE — Consult Note (Signed)
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 Patient is currently having a very loose BM and previous shift RN states that he had two loose BMs on her shift.  Today's RN will discuss an indwelling bowel management system with MD. Wound type:Pressure, moisture Pressure Ulcer POA: Yes Measurement: Left IT:  3 x 3 cm ulcer with 100% black, thick, soft eschar obscuring depth. Right IT: 5 x 3 cm ulcer with thin black soft eschar obscuring depth.  Coccyx: 5cm x 3cm area of scattered open areas embedded within a deeply discolored area of tissue damage.   Wound bed:As described above. Drainage (amount, consistency, odor) None Periwound:intact, dry Dressing procedure/placement/frequency:I will implement an enzymatic debriding agent to the bilateral ischial tuberosities and continue the soft silicone dressing to the sacral/coccyx area.  If stooling continues and a bowel management system is not employed, the dressings may not adhere.  In that case, use of a zinc-based skin protective ointment is recommended to the sacral/coccyx area. Guidance for repositioning, particularly in the sitting position is provided as HOB elevation over 30-degree angle will increase pressure to the ischial tuberosities. A pressure redistribution chair pad is provided to reduct pressure in the chair but guidance is provided to reposition patient every hour when up in chair. Bilateral heel protective boots came from facility with patient and these are to be used per our house protocol to prevent pressure ulceration to the heels, which are intact, but dry and vulnerable in his current state. [place] nursing team will not follow, but will remain available to this patient, the nursing and medical team.  Please re-consult if needed. Thanks, Maudie Flakes, MSN, RN, [place], [place], [place]  [phone])

## 2014-02-12 DIAGNOSIS — N390 Urinary tract infection, site not specified: Secondary | ICD-10-CM

## 2014-02-12 DIAGNOSIS — E870 Hyperosmolality and hypernatremia: Secondary | ICD-10-CM | POA: Diagnosis present

## 2014-02-12 DIAGNOSIS — R197 Diarrhea, unspecified: Secondary | ICD-10-CM | POA: Diagnosis present

## 2014-02-12 DIAGNOSIS — R6521 Severe sepsis with septic shock: Secondary | ICD-10-CM

## 2014-02-12 DIAGNOSIS — A419 Sepsis, unspecified organism: Secondary | ICD-10-CM | POA: Diagnosis present

## 2014-02-12 DIAGNOSIS — A499 Bacterial infection, unspecified: Secondary | ICD-10-CM | POA: Diagnosis present

## 2014-02-12 DIAGNOSIS — I421 Obstructive hypertrophic cardiomyopathy: Secondary | ICD-10-CM | POA: Diagnosis present

## 2014-02-12 DIAGNOSIS — A498 Other bacterial infections of unspecified site: Secondary | ICD-10-CM

## 2014-02-12 DIAGNOSIS — Z1612 Extended spectrum beta lactamase (ESBL) resistance: Secondary | ICD-10-CM

## 2014-02-12 DIAGNOSIS — B9620 Unspecified Escherichia coli [E. coli] as the cause of diseases classified elsewhere: Secondary | ICD-10-CM | POA: Diagnosis present

## 2014-02-12 DIAGNOSIS — F0390 Unspecified dementia without behavioral disturbance: Secondary | ICD-10-CM | POA: Diagnosis present

## 2014-02-12 LAB — BASIC METABOLIC PANEL
BUN: 30 mg/dL — ABNORMAL HIGH (ref 6–23)
BUN: 27 mg/dL — AB (ref 6–23)
CO2: 22 mEq/L (ref 19–32)
CO2: 20 mEq/L (ref 19–32)
Calcium: 7.3 mg/dL — ABNORMAL LOW (ref 8.4–10.5)
Calcium: 7.3 mg/dL — ABNORMAL LOW (ref 8.4–10.5)
Chloride: 111 mEq/L (ref 96–112)
Chloride: 111 mEq/L (ref 96–112)
Creatinine, Ser: 1.02 mg/dL (ref 0.50–1.35)
Creatinine, Ser: 0.97 mg/dL (ref 0.50–1.35)
GFR calc Af Amer: >90 mL/min (ref >90)
GFR calc non Af Amer: 71 mL/min — ABNORMAL LOW (ref >90)
GFR, EST AFRICAN AMERICAN: 82 mL/min — AB (ref >90)
GFR, EST NON AFRICAN AMERICAN: 80 mL/min — AB (ref >90)
Glucose, Bld: 203 mg/dL — ABNORMAL HIGH (ref 70–99)
Glucose, Bld: 227 mg/dL — ABNORMAL HIGH (ref 70–99)
POTASSIUM: 3 meq/L — AB (ref 3.7–5.3)
Potassium: 3.4 mEq/L — ABNORMAL LOW (ref 3.7–5.3)
Sodium: 145 mEq/L (ref 137–147)
Sodium: 144 mEq/L (ref 137–147)

## 2014-02-12 LAB — URINE CULTURE

## 2014-02-12 LAB — GLUCOSE, CAPILLARY
GLUCOSE-CAPILLARY: 171 mg/dL — AB (ref 70–99)
GLUCOSE-CAPILLARY: 172 mg/dL — AB (ref 70–99)
Glucose-Capillary: 181 mg/dL — ABNORMAL HIGH (ref 70–99)
Glucose-Capillary: 186 mg/dL — ABNORMAL HIGH (ref 70–99)
Glucose-Capillary: 151 mg/dL — ABNORMAL HIGH (ref 70–99)
Glucose-Capillary: 164 mg/dL — ABNORMAL HIGH (ref 70–99)

## 2014-02-12 LAB — PROCALCITONIN: Procalcitonin: 1.22 ng/mL

## 2014-02-12 MED ORDER — INSULIN ASPART 100 UNIT/ML ~~LOC~~ SOLN
0.0000 [IU] | Freq: Three times a day (TID) | SUBCUTANEOUS | Status: DC
  Administered 2014-02-12: 3 [IU] via SUBCUTANEOUS
  Administered 2014-02-13: 5 [IU] via SUBCUTANEOUS
  Administered 2014-02-14 – 2014-02-15 (×3): 3 [IU] via SUBCUTANEOUS
  Administered 2014-02-15: 09:00:00 via SUBCUTANEOUS
  Administered 2014-02-15: 5 [IU] via SUBCUTANEOUS
  Administered 2014-02-16 – 2014-02-17 (×4): 3 [IU] via SUBCUTANEOUS

## 2014-02-12 MED ORDER — POTASSIUM CHLORIDE CRYS ER 20 MEQ PO TBCR
40.0000 meq | EXTENDED_RELEASE_TABLET | ORAL | Status: AC
  Administered 2014-02-12 (×2): 40 meq via ORAL
  Filled 2014-02-12 (×2): qty 2

## 2014-02-12 MED ORDER — SODIUM CHLORIDE 0.9 % IV SOLN
250.0000 mg | Freq: Four times a day (QID) | INTRAVENOUS | Status: DC
  Administered 2014-02-12 – 2014-02-17 (×20): 250 mg via INTRAVENOUS
  Filled 2014-02-12 (×23): qty 250

## 2014-02-12 MED ORDER — LINAGLIPTIN 5 MG PO TABS
5.0000 mg | ORAL_TABLET | Freq: Every day | ORAL | Status: DC
  Administered 2014-02-12 – 2014-02-13 (×2): 5 mg via ORAL
  Filled 2014-02-12 (×2): qty 1

## 2014-02-12 MED ORDER — TAMSULOSIN HCL 0.4 MG PO CAPS
0.4000 mg | ORAL_CAPSULE | Freq: Every day | ORAL | Status: DC
  Administered 2014-02-12 – 2014-02-16 (×5): 0.4 mg via ORAL
  Filled 2014-02-12 (×6): qty 1

## 2014-02-12 MED ORDER — METOPROLOL TARTRATE 12.5 MG HALF TABLET
12.5000 mg | ORAL_TABLET | Freq: Two times a day (BID) | ORAL | Status: DC
  Administered 2014-02-12 (×2): 12.5 mg via ORAL
  Filled 2014-02-12 (×4): qty 1

## 2014-02-12 MED ORDER — INSULIN ASPART 100 UNIT/ML ~~LOC~~ SOLN: 0.0000 [IU] | Freq: Every day | SUBCUTANEOUS | Status: DC

## 2014-02-12 NOTE — Clinical Social Work Psychosocial (Signed)
Clinical Social Work Department BRIEF PSYCHOSOCIAL ASSESSMENT 02/12/2014  Patient:  CAREL, CARRIER     Account Number:  [id number]     Admit date:  02/10/2014  Clinical Social Worker:  Wylene Men  Date/Time:  02/12/2014 11:41 AM  Referred by:  Care Management  Date Referred:  02/12/2014 Referred for  SNF Placement   Other Referral:   none   Interview type:  Other - See comment Other interview type:   sister, Delanie    PSYCHOSOCIAL DATA Living Status:  FACILITY Admitted from facility:  [facility] Level of care:  [place] Primary support name:  Delanie Primary support relationship to patient:  S[place]LING Degree of support available:   adequate    CURRENT CONCERNS Current Concerns  Post-Acute Placement   Other Concerns:   pt to return to [facility] when medically stable for dc    SOCIAL WORK ASSESSMENT / PLAN Pt only AOx1.  CSW spoke with pt sister, Delanie who is main Media planner for pt.  Sister reports that pt is from [facility], [place], where he has resided since April 2014. Sister would like for pt to return to [facility] when medically stable.    Sister had concerns with pt not eating.  CSW referred sister to RN for followup as CSW was uncertain by reading the chart if pt was still NPO.  Sister states she is hopeful rearding her brother's prognosis "if we can get him to eat".    Pt transferred from 52M to 3W.  CSW gave report to 30M CSW to follow up.   Assessment/plan status:  Psychosocial Support/Ongoing Assessment of Needs Other assessment/ plan:   none   Information/referral to community resources:   SNF    PATIENT'S/FAMILY'S RESPONSE TO PLAN OF CARE: Sister is agreeable to returning to [facility] upon dc. Sister is appreciative of CSW assistance and support.       Nonnie Done, [place] [phone]  Clinical Social Work

## 2014-02-12 NOTE — Progress Notes (Signed)
ANTIBIOTIC CONSULT NOTE - FOLLOW UP  Pharmacy Consult for Primaxin Indication: ESBL E coli UTI  No Known Allergies  Patient Measurements: Height: 5\' 7"  (170.2 cm) Weight: 145 lb 6.4 oz (65.953 kg) IBW/kg (Calculated) : 66.1  Vital Signs: Temp: 98.9 F (37.2 C) (02/26 1334) Temp src: Oral (02/26 1334) BP: 137/65 mmHg (02/26 1334) Pulse Rate: 70 (02/26 1334) Intake/Output from previous day: 02/25 0701 - 02/26 0700 In: 617.7 [I.V.:517.7; IV Piggyback:100] Out: 1500 [Urine:1500]  Labs:  Recent Labs  02/10/14 1315 02/10/14 1721  02/11/14 2300 02/12/14 0520 02/12/14 1055  WBC 15.1* 13.1*  --   --   --   --   HGB 14.4 14.0  --   --   --   --   PLT 257 216  --   --   --   --   CREATININE 2.77* 2.41*  < > 1.06 1.02 0.97  < > = values in this interval not displayed. Estimated Creatinine Clearance: 63.3 ml/min (by C-G formula based on Cr of 0.97).  Microbiology: Recent Results (from the past 720 hour(s))  CULTURE, BLOOD (ROUTINE X 2)     Status: None   Collection Time    02/10/14  2:40 PM      Result Value Ref Range Status   Specimen Description BLOOD ARM LEFT   Final   Special Requests BOTTLES DRAWN AEROBIC AND ANAEROBIC 6CC   Final   Culture  Setup Time     Final   Value: 02/10/2014 22:10     Performed at [facility]   Culture     Final   Value:        BLOOD CULTURE RECEIVED NO GROWTH TO DATE CULTURE WILL BE HELD FOR 5 DAYS BEFORE ISSUING A FINAL NEGATIVE REPORT     Performed at [facility]   Report Status PENDING   Incomplete  URINE CULTURE     Status: None   Collection Time    02/10/14  3:37 PM      Result Value Ref Range Status   Specimen Description URINE, CATHETERIZED   Final   Special Requests NONE   Final   Culture  Setup Time     Final   Value: 02/10/2014 16:32     Performed at [facility]     Final   Value: 65,000 COLONIES/ML     Performed at [facility]   Culture     Final   Value: ESCHERICHIA COLI      Note: Confirmed Extended Spectrum Beta-Lactamase Producer (ESBL) CRITICAL RESULT CALLED TO, READ BACK BY AND VERIFIED WITH: JUDITH KAOME AT 6:01 A.M. ON 02/12/14 WARRB     Performed at [facility]   Report Status 02/12/2014 FINAL   Final   Organism ID, Bacteria ESCHERICHIA COLI   Final  CULTURE, BLOOD (ROUTINE X 2)     Status: None   Collection Time    02/10/14  5:21 PM      Result Value Ref Range Status   Specimen Description BLOOD HAND LEFT   Final   Special Requests BOTTLES DRAWN AEROBIC AND ANAEROBIC B 4CC R 3CC   Final   Culture  Setup Time     Final   Value: 02/10/2014 22:10     Performed at [facility]   Culture     Final   Value:        BLOOD CULTURE RECEIVED NO GROWTH TO DATE CULTURE  WILL BE HELD FOR 5 DAYS BEFORE ISSUING A FINAL NEGATIVE REPORT     Performed at [facility]   Report Status PENDING   Incomplete  MRSA PCR SCREENING     Status: None   Collection Time    02/10/14  5:50 PM      Result Value Ref Range Status   MRSA by PCR NEGATIVE  NEGATIVE Final   Comment:            The GeneXpert MRSA Assay (FDA     approved for NASAL specimens     only), is one component of a     comprehensive MRSA colonization     surveillance program. It is not     intended to diagnose MRSA     infection nor to guide or     monitor treatment for     MRSA infections.  CLOSTRIDIUM DIFFICILE BY PCR     Status: None   Collection Time    02/11/14 12:15 PM      Result Value Ref Range Status   C difficile by pcr NEGATIVE  NEGATIVE Final   Assessment:  Changing from Vancomycin and Zosyn to Primaxin for ESBL E coli UTI.  65 K/ml colonies in culture.  Goal of Therapy:  appropriate Primaxin dose for renal function and infection  Plan:   Primaxin 250 mg IV q6hrs.  Will follow renal function for any need to adjust regimen.  Arty Baumgartner, [place] Pager: [phone] 02/12/2014,1:56 PM

## 2014-02-12 NOTE — Clinical Social Work Note (Signed)
Pt transferred from 67M to 3W. Report given to 3W CSW.  67M CSW signing off.  Nonnie Done, [place] [phone]  Clinical Social Work

## 2014-02-12 NOTE — Progress Notes (Signed)
CRITICAL VALUE ALERT  Critical value received:Ecoli, ESBL   Date of notification:  02/12/14  Time of notification:  0601  Critical value read back:yes  Nurse who received alert:  Estanislado Pandy  MD notified (1st page):  Dr. Hilbert Bible  Time of first page:  0605   MD notified (2nd page):  Time of second page:  Responding MD:  Dr. Hilbert Bible  Time MD responded: [phone]

## 2014-02-12 NOTE — Evaluation (Signed)
Physical Therapy Evaluation Patient Details Name: Jerry Stanley MRN: [id number] DOB: [birth year] Today's Date: 02/12/2014 Time: 8527-7824 PT Time Calculation (min): 17 min  PT Assessment / Plan / Recommendation History of Present Illness  73 y.o. male admitted to [facility] on 02/10/14 with hypotension, progressive lethargy over the past 2 weeks, and fever to 103 day of admit. Initial BP in ER in 23'N systolic. Dx with severe sepsis and septic shock likely due to UTI, diarrhea, dehydration, A-fib with RVR, hypernatremia, and severe protein calorie malnutrition. He is from [facility]. He has significant PMHx of stroke, dementia, HTN, DM, cardiomyopathy, prostate CA, and chronic back pain.    Clinical Impression  This unfortunate gentleman presents with L>R lower extremity contractures and pressure wounds sacrum, bil legs and heels.  He has likely been bed bound for quite some time now and does not have potential to regain his mobility.  He is not currently appropriate for acute PT.  He is appropriate to return to SNF at discharge.    PT Assessment  Patent does not need any further PT services    Follow Up Recommendations  SNF    Does the patient have the potential to tolerate intense rehabilitation    NA  Barriers to Discharge   None      Equipment Recommendations  None recommended by PT    Recommendations for Other Services   None  Frequency   NA- one time eval and d/c   Precautions / Restrictions Precautions Precautions: Other (comment) Precaution Comments: rectal tube, contom cath, skin breakdown   Pertinent Vitals/Pain See vitals flow sheet.      Mobility  Bed Mobility Overal bed mobility: Needs Assistance Bed Mobility: Rolling Rolling: Mod assist General bed mobility comments: mod assist to roll bil, however, pt able to use his arms on the bedrail to assist.  He has very little control over his legs and needs assist at legs and pelvis to roll all the way over.  He is very  fearful of falling and clutches at the railing pushing into extension as therapist has him roll all the way to side lying to change out wet and dirty bed linens.         PT Goals(Current goals can be found in the care plan section) Acute Rehab PT Goals Patient Stated Goal: none stated PT Goal Formulation: No goals set, d/c therapy  Visit Information  Last PT Received On: 02/12/14 Assistance Needed: +2 History of Present Illness: 73 y.o. male admitted to [facility] on 02/10/14 with hypotension, progressive lethargy over the past 2 weeks, and fever to 103 day of admit. Initial BP in ER in 36'R systolic. Dx with severe sepsis and septic shock likely due to UTI, diarrhea, dehydration, A-fib with RVR, hypernatremia, and severe protein calorie malnutrition. He is from [facility]. He has significant PMHx of stroke, dementia, HTN, DM, cardiomyopathy, prostate CA, and chronic back pain.         Prior [place] expects to be discharged to:: Skilled nursing facility Prior Function Level of Independence: Needs assistance Gait / Transfers Assistance Needed: has not walked in what appears to be a very long time ADL's / Homemaking Assistance Needed: total assist Communication Communication: Other (comment) (pt speaks in soft whispers, one or two word responses)    Cognition  Cognition Arousal/Alertness: Awake/alert Behavior During Therapy: Flat affect Overall Cognitive Status: No family/caregiver present to determine baseline cognitive functioning    Extremity/Trunk Assessment Upper Extremity Assessment Upper Extremity  Assessment: Generalized weakness Lower Extremity Assessment Lower Extremity Assessment: RLE deficits/detail;LLE deficits/detail RLE Deficits / Details: right leg is the strongest of both extremities. The pt can move the leg acitvely in gravity eliminated position, but not against gravity without assist.  He is unable to fully extend his right leg (lacking  ~20 degrees of full extension), but can extend it much more than his left leg.  LLE Deficits / Details: left leg with significant knee and hip flexion contractures.  Pressure wound on knee (likely from a bedrail) heel and sacrum.  Pt unable to actively move this leg at all and grimices in pain with extension of the knee and hip Cervical / Trunk Assessment Cervical / Trunk Assessment: Normal      End of Session PT - End of Session Activity Tolerance: Patient limited by pain Patient left: in chair;with call bell/phone within reach;with nursing/sitter in room;Other (comment) (in left sidelying for pressure relief. ) Nurse Communication: Mobility status;Other (comment) (condom catheter came off and IV beeping)    [name], [place], DPT [phone]   02/12/2014, 4:05 PM

## 2014-02-12 NOTE — Progress Notes (Signed)
[facility]  Jerry Stanley NMM:[id number] DOB: October 10, 1941 DOA: 02/10/2014 PCP: Default, Provider, MD  Brief narrative: 73 y.o. M from [place], brought in by EMS for hypotension, progressive lethargy over the past 2 weeks, and fever to 103 day of admit. Initial BP in ER in 31'R systolic. PCCM asked to admit   Assessment/Plan: Active Problems:   Severe sepsis with septic shock -sepsis physiology resolved -etiology UTI (see below) -mild elevation lactic acid compounded by concomitant use of Metformin pre admit -cont supportive care -narrow anbx's    Atrial fibrillation with RVR -onset while in shock and DH-possible BB withdrawal -convert to NSR with IV Amiodarone now off -cont prn IV Lopressor     ESBL producing bacteria infection/ E. coli UTI -dc Zosyn in favor of Carbepenem which has an oral equivalent -dc Vancomycin -if has Foley consider dc 24 hrs after Flomax resumed (see below)    Hypertension -BP improved -on Diovan and lopressor pre admit -resume low dose BB and slow titrate up -resume Flomax to prevent urinary retention    Diabetes mellitus type 2 in non obese -CBG > 200 -increase SSI -resume home Onglyza -holding home metformin for now    Diarrhea -cont flexiseal -c diff negative -?? Due to UTI and anbx's    Dehydration with hypernatremia -resolved but poor oral intake noted so follow closely    History of stroke/Dementia -cont Plavix -cont Aricept    Protein-calorie malnutrition, severe -poor intake- with CVA history check swallow eval    HOCM (hypertrophic obstructive cardiomyopathy) -no prior ECHO- listed as pb on SNF records   Bilateral initial tuberosity pressure ulcers/unstable -WOC RN following- present before admission     BPH - resume Flomax and given voiding trial in 2 days   DVT prophylaxis: Cutaneous heparin Code Status: DO NOT RESUSCITATE Family Communication: No family at  bedside Disposition Plan/Expected LOS: Telemetry-eventual return to skilled nursing facility   Consultants: Critical care medicine  Procedures: None  CULTURES:  Blood x 2 2/24 >>> no growth to date Urine 2/24 >>> E. ESBL Escherichia coli sensitive to Zosyn and Primaxin C diff 2/25 >>> negative  Antibiotics: Zosyn 2/24 >>> Vancomycin 2/24 >>> 2/26  HPI/Subjective: Alert. No complaints.   Objective: Blood pressure 131/56, pulse 70, temperature 99.3 F (37.4 C), temperature source Oral, resp. rate 18, height 5\' 7"  (1.702 m), weight 145 lb 6.4 oz (65.953 kg), SpO2 100.00%.  Intake/Output Summary (Last 24 hours) at 02/12/14 1209 Last data filed at 02/12/14 0550  Gross per 24 hour  Intake   91.7 ml  Output   1150 ml  Net -1058.3 ml     Exam: General: No acute respiratory distress Lungs: Clear to auscultation bilaterally without wheezes or crackles, RA Cardiovascular: Regular rate and rhythm without murmur gallop or rub normal S1 and S2, no peripheral edema or JVD Abdomen: Nontender, nondistended, soft, bowel sounds positive, no rebound, no ascites, no appreciable mass, rectal tube in place with liquid light brown returns noted Musculoskeletal: No significant cyanosis, clubbing of bilateral lower extremities Neurological: Alert and oriented x name only noting limited verbal response to questions asked-?? 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 aspirin EC  81 mg Oral Daily  . atorvastatin  20 mg Oral q1800  . clopidogrel  75 mg Oral Q breakfast  . collagenase   Topical Daily  . donepezil  10  mg Oral QHS  . heparin  5,000 Units Subcutaneous 3 times per day  . insulin aspart  0-15 Units Subcutaneous TID WC  . insulin aspart  0-5 Units Subcutaneous QHS  . piperacillin-tazobactam (ZOSYN)  IV  3.375 g Intravenous Q8H  . potassium chloride  40 mEq Oral Q4H   Continuous Infusions: .  dextrose 75 mL (02/11/14 1034)    Data Reviewed: Basic Metabolic Panel:  Recent Labs Lab 02/10/14 1315  02/11/14 0500 02/11/14 1155 02/11/14 2300 02/12/14 0520 02/12/14 1055  NA 155*  --  154* 153* 146 145 144  K 5.0  --  3.6* 4.0 3.6* 3.0* 3.4*  CL 116*  --  124* 120* 112 111 111  CO2 17*  --  18* 19 20 22 20   GLUCOSE 279*  --  138* 161* 210* 203* 227*  BUN 93*  --  54* 45* 33* 30* 27*  CREATININE 2.77*  < > 1.42* 1.17 1.06 1.02 0.97  CALCIUM 8.6  --  7.1* 7.3* 7.3* 7.3* 7.3*  MG  --   --  1.7  --   --   --   --   PHOS  --   --  2.5  --   --   --   --   < > = values in this interval not displayed. Liver Function Tests:  Recent Labs Lab 02/10/14 1315  AST 555*  ALT 285*  ALKPHOS 58  BILITOT 1.6*  PROT 8.0  ALBUMIN 2.4*   No results found for this basename: LIPASE, AMYLASE,  in the last 168 hours No results found for this basename: AMMONIA,  in the last 168 hours CBC:  Recent Labs Lab 02/10/14 1315 02/10/14 1721  WBC 15.1* 13.1*  HGB 14.4 14.0  HCT 43.1 42.1  MCV 98.2 98.4  PLT 257 216   Cardiac Enzymes:  Recent Labs Lab 02/10/14 1316  TROPONINI <0.30   BNP (last 3 results) No results found for this basename: PROBNP,  in the last 8760 hours CBG:  Recent Labs Lab 02/11/14 2243 02/12/14 0108 02/12/14 0356 02/12/14 0811 02/12/14 1155  GLUCAP 172* 181* 186* 171* 151*    Recent Results (from the past 240 hour(s))  CULTURE, BLOOD (ROUTINE X 2)     Status: None   Collection Time    02/10/14  2:40 PM      Result Value Ref Range Status   Specimen Description BLOOD ARM LEFT   Final   Special Requests BOTTLES DRAWN AEROBIC AND ANAEROBIC 6CC   Final   Culture  Setup Time     Final   Value: 02/10/2014 22:10     Performed at [facility]   Culture     Final   Value:        BLOOD CULTURE RECEIVED NO GROWTH TO DATE CULTURE WILL BE HELD FOR 5 DAYS BEFORE ISSUING A FINAL NEGATIVE REPORT     Performed at [facility]   Report Status  PENDING   Incomplete  URINE CULTURE     Status: None   Collection Time    02/10/14  3:37 PM      Result Value Ref Range Status   Specimen Description URINE, CATHETERIZED   Final   Special Requests NONE   Final   Culture  Setup Time     Final   Value: 02/10/2014 16:32     Performed at [place]     Final   Value: 65,000  COLONIES/ML     Performed at [facility]     Final   Value: ESCHERICHIA COLI     Note: Confirmed Extended Spectrum Beta-Lactamase Producer (ESBL) CRITICAL RESULT CALLED TO, READ BACK BY AND VERIFIED WITH: JUDITH KAOME AT 6:01 A.M. ON 02/12/14 WARRB     Performed at [facility]   Report Status 02/12/2014 FINAL   Final   Organism ID, Bacteria ESCHERICHIA COLI   Final  CULTURE, BLOOD (ROUTINE X 2)     Status: None   Collection Time    02/10/14  5:21 PM      Result Value Ref Range Status   Specimen Description BLOOD HAND LEFT   Final   Special Requests BOTTLES DRAWN AEROBIC AND ANAEROBIC B 4CC R 3CC   Final   Culture  Setup Time     Final   Value: 02/10/2014 22:10     Performed at [facility]   Culture     Final   Value:        BLOOD CULTURE RECEIVED NO GROWTH TO DATE CULTURE WILL BE HELD FOR 5 DAYS BEFORE ISSUING A FINAL NEGATIVE REPORT     Performed at [facility]   Report Status PENDING   Incomplete  MRSA PCR SCREENING     Status: None   Collection Time    02/10/14  5:50 PM      Result Value Ref Range Status   MRSA by PCR NEGATIVE  NEGATIVE Final   Comment:            The GeneXpert MRSA Assay (FDA     approved for NASAL specimens     only), is one component of a     comprehensive MRSA colonization     surveillance program. It is not     intended to diagnose MRSA     infection nor to guide or     monitor treatment for     MRSA infections.  CLOSTRIDIUM DIFFICILE BY PCR     Status: None   Collection Time    02/11/14 12:15 PM      Result Value Ref Range Status   C difficile by pcr  NEGATIVE  NEGATIVE Final     Studies:  Recent x-ray studies have been reviewed in detail by the Attending Physician  Time spent :     Erin Hearing, [place] [facility] Office  [phone] Pager [phone]  **If unable to reach the above provider after paging please contact the [place] @ [phone]  On-Call/Text Page:      [email or web address]      password [email or web address]  If 7PM-7AM, please contact night-coverage [email or web address] Password [facility] 02/12/2014, 12:09 PM   LOS: 2 days   I have examined the patient, reviewed the chart and modified the above note which I agree with.   Cayson Kalb,MD [phone] 02/12/2014, 4:29 PM

## 2014-02-13 DIAGNOSIS — R6521 Severe sepsis with septic shock: Secondary | ICD-10-CM

## 2014-02-13 DIAGNOSIS — E860 Dehydration: Secondary | ICD-10-CM

## 2014-02-13 DIAGNOSIS — A419 Sepsis, unspecified organism: Secondary | ICD-10-CM

## 2014-02-13 DIAGNOSIS — E119 Type 2 diabetes mellitus without complications: Secondary | ICD-10-CM

## 2014-02-13 DIAGNOSIS — I4891 Unspecified atrial fibrillation: Secondary | ICD-10-CM

## 2014-02-13 LAB — BASIC METABOLIC PANEL
BUN: 19 mg/dL (ref 6–23)
CHLORIDE: 111 meq/L (ref 96–112)
CO2: 22 mEq/L (ref 19–32)
Calcium: 7.4 mg/dL — ABNORMAL LOW (ref 8.4–10.5)
Creatinine, Ser: 0.83 mg/dL (ref 0.50–1.35)
GFR calc non Af Amer: 85 mL/min — ABNORMAL LOW (ref >90)
Glucose, Bld: 194 mg/dL — ABNORMAL HIGH (ref 70–99)
POTASSIUM: 3.5 meq/L — AB (ref 3.7–5.3)
Sodium: 143 mEq/L (ref 137–147)

## 2014-02-13 LAB — GLUCOSE, CAPILLARY
Glucose-Capillary: 196 mg/dL — ABNORMAL HIGH (ref 70–99)
Glucose-Capillary: 243 mg/dL — ABNORMAL HIGH (ref 70–99)
Glucose-Capillary: 124 mg/dL — ABNORMAL HIGH (ref 70–99)
Glucose-Capillary: 157 mg/dL — ABNORMAL HIGH (ref 70–99)

## 2014-02-13 LAB — CBC
HEMATOCRIT: 34.2 % — AB (ref 39.0–52.0)
HEMOGLOBIN: 11.3 g/dL — AB (ref 13.0–17.0)
MCH: 31.2 pg (ref 26.0–34.0)
MCHC: 33 g/dL (ref 30.0–36.0)
MCV: 94.5 fL (ref 78.0–100.0)
Platelets: 158 10*3/uL (ref 150–400)
RBC: 3.62 MIL/uL — AB (ref 4.22–5.81)
RDW: 12.8 % (ref 11.5–15.5)
WBC: 6 10*3/uL (ref 4.0–10.5)

## 2014-02-13 MED ORDER — SODIUM CHLORIDE 0.9 % IV SOLN
INTRAVENOUS | Status: DC
  Administered 2014-02-13 – 2014-02-16 (×2): via INTRAVENOUS

## 2014-02-13 MED ORDER — METOPROLOL TARTRATE 25 MG PO TABS
25.0000 mg | ORAL_TABLET | Freq: Two times a day (BID) | ORAL | Status: DC
  Administered 2014-02-13 – 2014-02-17 (×8): 25 mg via ORAL
  Filled 2014-02-13 (×9): qty 1

## 2014-02-13 MED ORDER — HYDROCODONE-ACETAMINOPHEN 5-325 MG PO TABS
1.0000 | ORAL_TABLET | Freq: Four times a day (QID) | ORAL | Status: DC | PRN
  Administered 2014-02-13 (×2): 1 via ORAL
  Filled 2014-02-13 (×2): qty 1

## 2014-02-13 NOTE — Care Management Note (Addendum)
    Page 1 of 2   02/17/2014     2:28:01 PM   CARE MANAGEMENT NOTE 02/17/2014  Patient:  Jerry Stanley, Jerry Stanley   Account Number:  [id number]  Date Initiated:  02/11/2014  Documentation initiated by:  Luz Lex  Subjective/Objective Assessment:   Sepsis - from SNF     Action/Plan:   Anticipated DC Date:  02/16/2014   Anticipated DC Plan:  [place]  In-house referral  Clinical Social Worker      DC Planning Services  CM consult      [facility] Choice  LONG TERM ACUTE CARE   Choice offered to / List presented to:  C-5 Sibling           Status of service:  Completed, signed off Medicare Important Message given?   (If response is "NO", the following Medicare IM given date fields will be blank) Date Medicare IM given:   Date Additional Medicare IM given:    Discharge Disposition:  LONG TERM ACUTE CARE (LTAC)  Per UR Regulation:  Reviewed for med. necessity/level of care/duration of stay  If discussed at [place] of Stay Meetings, dates discussed:   02/17/2014    Comments:  Contact:  Jerry Stanley,Jerry Stanley Sister [phone]  02-17-14 [place], RN, BSN [phone] CM to fax d/c summary to [facility]. [facility] to transport pt to [facility] rm [facility]. RN to call for transportation and to call Sister once pt is ready to be transferred.  02-17-14 [place], [place] [phone] Pt was discussed in [place] meeting this am. Sister agreeable to [facility].  Plan will be for LTAC. [facility] did call and a bed is available. CM did fax information to [facility] for [facility]. CM will conitnue to monitor.  02-16-14 [place], [place] [phone] CM did call [facility] Liaison this am and she stated that pt will best be SNF appropriate. [facility] stated that pt is appropriate for LTAC however no bed available. PER MD pt still having diarreha and will need flexiseal along with IV ABX for several more days. Plan will either be LTAC vs  SNF.  02-13-14 [address] [place] [phone] CM did receive call from Dr. Broadus John to see if pt will be a candidate for LTAC. CM did call [facility] and they will have to look at pt on Monday- no beds available over the weekend per liaison. CM also called Juliann Pulse with [facility] to see if they can see if appropriate for LTAC. CM will continue to monitor. CSW to still f/u for return to SNF for IV ABX if possible.  02-11-14 8:40am [place] [phone] From [place] referral placed.

## 2014-02-13 NOTE — Progress Notes (Signed)
PCCM and Team 1 transfer  Jerry Stanley VOJ:[id number] DOB: Mar 26, 1941 DOA: 02/10/2014 PCP: Default, Provider, MD  Brief narrative: 73 y.o. M from [place], brought in by EMS for hypotension, progressive lethargy over the past 2 weeks, and fever to 103 day of admit. Initial BP in ER in 99'B systolic. PCCM asked to admit.  Assessment/Plan:    Severe sepsis with septic shock -sepsis physiology resolved -etiology UTI (see below) -mild elevation lactic acid compounded by concomitant use of Metformin pre admit -cont supportive care -now on Primaxin day 2/10    Atrial fibrillation with RVR -onset while in shock and DH-possible BB withdrawal -convert to NSR with IV Amiodarone now off -rate improved, increase PO metoprolol     ESBL producing bacteria infection/ E. 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 Due to UTI and anbx's    Dehydration with hypernatremia -resolved but poor oral intake noted so follow closely    History of stroke/Dementia -cont Plavix -cont Aricept    Protein-calorie malnutrition, severe -poor intake- with CVA history check swallow eval    HOCM (hypertrophic obstructive cardiomyopathy) -no prior ECHO- listed as pb on SNF records   Bilateral initial tuberosity pressure ulcers/unstable -WOC RN following- present before admission    BPH - continue flomax, foley Dced   DVT prophylaxis: Cutaneous heparin Code Status: DO NOT RESUSCITATE Family Communication: No family at bedside Disposition Plan/Expected LOS: Telemetry-eventual return to skilled nursing facility   Consultants: Critical care  medicine  Procedures: None  CULTURES:  Blood x 2 2/24 >>> no growth to date Urine 2/24 >>> E. ESBL Escherichia coli sensitive to Zosyn and Primaxin C diff 2/25 >>> negative  Antibiotics: Zosyn 2/24 >>> Vancomycin 2/24 >>> 2/26  HPI/Subjective: Alert. No complaints, still with loose stools  Objective: Blood pressure 99/50, pulse 72, temperature 98.3 F (36.8 C), temperature source Oral, resp. rate 16, height 5\' 7"  (1.702 m), weight 66.996 kg (147 lb 11.2 oz), SpO2 98.00%.  Intake/Output Summary (Last 24 hours) at 02/13/14 1412 Last data filed at 02/13/14 1259  Gross per 24 hour  Intake   1680 ml  Output    950 ml  Net    730 ml     Exam: General: No acute respiratory distress Lungs: Clear to auscultation bilaterally without wheezes or crackles, RA Cardiovascular: Regular rate and rhythm without murmur gallop or rub normal S1 and S2, no peripheral edema or JVD Abdomen: Nontender, nondistended, soft, bowel sounds positive, no rebound, no ascites, no appreciable mass, rectal tube in place with liquid light brown returns noted Musculoskeletal: No significant cyanosis, clubbing of bilateral lower extremities Neurological: Alert and oriented x name only noting limited verbal response to questions asked-?? 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 aspirin EC  81 mg Oral Daily  . atorvastatin  20 mg Oral q1800  . clopidogrel  75 mg Oral Q breakfast  . collagenase   Topical Daily  . donepezil  10 mg Oral QHS  . heparin  5,000 Units Subcutaneous 3 times per day  . imipenem-cilastatin  250 mg  Intravenous 4 times per day  . insulin aspart  0-15 Units Subcutaneous TID WC  . insulin aspart  0-5 Units Subcutaneous QHS  . linagliptin  5 mg Oral Daily  . metoprolol tartrate  12.5 mg Oral BID  . tamsulosin  0.4 mg Oral QPC supper   Continuous Infusions: . dextrose 75 mL (02/11/14 1034)    Data  Reviewed: Basic Metabolic Panel:  Recent Labs Lab 02/10/14 1315  02/11/14 0500 02/11/14 1155 02/11/14 2300 02/12/14 0520 02/12/14 1055 02/13/14 0545  NA 155*  --  154* 153* 146 145 144 143  K 5.0  --  3.6* 4.0 3.6* 3.0* 3.4* 3.5*  CL 116*  --  124* 120* 112 111 111 111  CO2 17*  --  18* 19 20 22 20 22   GLUCOSE 279*  --  138* 161* 210* 203* 227* 194*  BUN 93*  --  54* 45* 33* 30* 27* 19  CREATININE 2.77*  < > 1.42* 1.17 1.06 1.02 0.97 0.83  CALCIUM 8.6  --  7.1* 7.3* 7.3* 7.3* 7.3* 7.4*  MG  --   --  1.7  --   --   --   --   --   PHOS  --   --  2.5  --   --   --   --   --   < > = values in this interval not displayed. Liver Function Tests:  Recent Labs Lab 02/10/14 1315  AST 555*  ALT 285*  ALKPHOS 58  BILITOT 1.6*  PROT 8.0  ALBUMIN 2.4*   No results found for this basename: LIPASE, AMYLASE,  in the last 168 hours No results found for this basename: AMMONIA,  in the last 168 hours CBC:  Recent Labs Lab 02/10/14 1315 02/10/14 1721 02/13/14 0545  WBC 15.1* 13.1* 6.0  HGB 14.4 14.0 11.3*  HCT 43.1 42.1 34.2*  MCV 98.2 98.4 94.5  PLT 257 216 158   Cardiac Enzymes:  Recent Labs Lab 02/10/14 1316  TROPONINI <0.30   BNP (last 3 results) No results found for this basename: PROBNP,  in the last 8760 hours CBG:  Recent Labs Lab 02/12/14 1155 02/12/14 1645 02/12/14 2221 02/13/14 0806 02/13/14 1144  GLUCAP 151* 164* 172* 196* 243*    Recent Results (from the past 240 hour(s))  CULTURE, BLOOD (ROUTINE X 2)     Status: None   Collection Time    02/10/14  2:40 PM      Result Value Ref Range Status   Specimen Description BLOOD ARM LEFT   Final   Special Requests BOTTLES DRAWN AEROBIC AND ANAEROBIC 6CC   Final   Culture  Setup Time     Final   Value: 02/10/2014 22:10     Performed at [facility]   Culture     Final   Value:        BLOOD CULTURE RECEIVED NO GROWTH TO DATE CULTURE WILL BE HELD FOR 5 DAYS BEFORE ISSUING A FINAL NEGATIVE REPORT      Performed at [facility]   Report Status PENDING   Incomplete  URINE CULTURE     Status: None   Collection Time    02/10/14  3:37 PM      Result Value Ref Range Status   Specimen Description URINE, CATHETERIZED   Final   Special Requests NONE   Final   Culture  Setup Time     Final   Value: 02/10/2014 16:32  Performed at [facility]     Final   Value: 65,000 COLONIES/ML     Performed at [facility]   Culture     Final   Value: ESCHERICHIA COLI     Note: Confirmed Extended Spectrum Beta-Lactamase Producer (ESBL) CRITICAL RESULT CALLED TO, READ BACK BY AND VERIFIED WITH: JUDITH KAOME AT 6:01 A.M. ON 02/12/14 WARRB     Performed at [facility]   Report Status 02/12/2014 FINAL   Final   Organism ID, Bacteria ESCHERICHIA COLI   Final  CULTURE, BLOOD (ROUTINE X 2)     Status: None   Collection Time    02/10/14  5:21 PM      Result Value Ref Range Status   Specimen Description BLOOD HAND LEFT   Final   Special Requests BOTTLES DRAWN AEROBIC AND ANAEROBIC B 4CC R 3CC   Final   Culture  Setup Time     Final   Value: 02/10/2014 22:10     Performed at [facility]   Culture     Final   Value:        BLOOD CULTURE RECEIVED NO GROWTH TO DATE CULTURE WILL BE HELD FOR 5 DAYS BEFORE ISSUING A FINAL NEGATIVE REPORT     Performed at [facility]   Report Status PENDING   Incomplete  MRSA PCR SCREENING     Status: None   Collection Time    02/10/14  5:50 PM      Result Value Ref Range Status   MRSA by PCR NEGATIVE  NEGATIVE Final   Comment:            The GeneXpert MRSA Assay (FDA     approved for NASAL specimens     only), is one component of a     comprehensive MRSA colonization     surveillance program. It is not     intended to diagnose MRSA     infection nor to guide or     monitor treatment for     MRSA infections.  CLOSTRIDIUM DIFFICILE BY PCR     Status: None   Collection Time    02/11/14 12:15 PM       Result Value Ref Range Status   C difficile by pcr NEGATIVE  NEGATIVE Final    02/13/2014, 2:12 PM   LOS: 3 days   Azlynn Mitnick,MD [phone]  02/13/2014, 2:12 PM

## 2014-02-13 NOTE — Progress Notes (Signed)
Foley d/c 2 days ago, per Night shift RN.  Condom cath currently in place.  Flexiseal draining watery stool. MD made aware.

## 2014-02-13 NOTE — Evaluation (Signed)
Clinical/Bedside Swallow Evaluation Patient Details  Name: Missael Ferrari MRN: [id number] Date of Birth: 1941-01-09  Today's Date: 02/13/2014 Time: 1150-1230 SLP Time Calculation (min): 40 min  Past Medical History:  Past Medical History  Diagnosis Date  . Stroke 02/2013  . Diabetes mellitus without complication   . Hypertension   . Dementia   . Asthma   . 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 History of prostate cancer     in Remission   . Chronic back pain    Past Surgical History: History reviewed. No pertinent past surgical history. HPI:  73 y.o. M from [place], brought in by EMS for hypotension, progressive lethargy over the past 2 weeks, and fever to 103 day of admit.  Initial BP in ER in 90'Z systolic.  PCCM asked to admit.   Assessment / Plan / Recommendation Clinical Impression  No overt s/s of aspiration noted at bedside, even with large consecutive swallows of thin liquids via straw, however, pt. does appear to have a moderate oral phase dysphagia, with prolonged chewing and oral holding of boluses (similar to a dementia feeding pattern).    Aspiration Risk  Mild    Diet Recommendation Dysphagia 3 (Mechanical Soft);Thin liquid   Liquid Administration via: Straw Medication Administration: Whole meds with puree Supervision: Staff to assist with self feeding;Full supervision/cueing for compensatory strategies Compensations: Slow rate;Small sips/bites;Check for pocketing Postural Changes and/or Swallow Maneuvers: Seated upright 90 degrees    Other  Recommendations Oral Care Recommendations: Oral care Q4 per protocol Other Recommendations: Clarify dietary restrictions   Follow Up Recommendations  Skilled Nursing facility    Frequency and Duration min 2x/week  2 weeks   Pertinent Vitals/Pain N/a    SLP Swallow Goals  see care plan    Swallow Study Prior Functional Status       General HPI: 73 y.o. M from [place], brought in by EMS for  hypotension, progressive lethargy over the past 2 weeks, and fever to 103 day of admit.  Initial BP in ER in 00'P systolic.  PCCM asked to admit. Type of Study: Bedside swallow evaluation Previous Swallow Assessment: none found Diet Prior to this Study: Dysphagia 3 (soft);Thin liquids Temperature Spikes Noted: Yes Respiratory Status: Nasal cannula History of Recent Intubation: No Behavior/Cognition: Alert;Cooperative;Requires cueing;Decreased sustained attention Oral Cavity - Dentition: Adequate natural dentition;Missing dentition;Poor condition Self-Feeding Abilities: Able to feed self;Needs assist;Needs set up Patient Positioning: Upright in bed Baseline Vocal Quality: Clear;Low vocal intensity Volitional Cough: Cognitively unable to elicit Volitional Swallow: Unable to elicit    Oral/Motor/Sensory Function Overall Oral Motor/Sensory Function: Appears within functional limits for tasks assessed Labial ROM: Within Functional Limits Labial Symmetry: Within Functional Limits   Ice Chips Ice chips: Within functional limits   Thin Liquid Thin Liquid: Within functional limits Presentation: Spoon;Cup;Straw    Nectar Thick Nectar Thick Liquid: Not tested   Honey Thick Honey Thick Liquid: Not tested   Puree Puree: Within functional limits   Solid   GO    Solid: Impaired Presentation: Spoon Oral Phase Impairments: Reduced lingual movement/coordination;Impaired anterior to posterior transit;Impaired mastication Oral Phase Functional Implications: Oral holding       Quinn Axe T 02/13/2014,12:51 PM

## 2014-02-14 DIAGNOSIS — F0390 Unspecified dementia without behavioral disturbance: Secondary | ICD-10-CM

## 2014-02-14 DIAGNOSIS — E860 Dehydration: Secondary | ICD-10-CM

## 2014-02-14 LAB — GLUCOSE, CAPILLARY
GLUCOSE-CAPILLARY: 154 mg/dL — AB (ref 70–99)
GLUCOSE-CAPILLARY: 163 mg/dL — AB (ref 70–99)
GLUCOSE-CAPILLARY: 134 mg/dL — AB (ref 70–99)
Glucose-Capillary: 178 mg/dL — ABNORMAL HIGH (ref 70–99)

## 2014-02-14 MED ORDER — LOPERAMIDE HCL 2 MG PO CAPS
2.0000 mg | ORAL_CAPSULE | Freq: Two times a day (BID) | ORAL | Status: DC
  Administered 2014-02-14 – 2014-02-15 (×3): 2 mg via ORAL
  Filled 2014-02-14 (×5): qty 1

## 2014-02-14 NOTE — Progress Notes (Signed)
PCCM and Team 1 transfer  Jerry Stanley WUJ:[id number] DOB: 10-24-1941 DOA: 02/10/2014 PCP: Default, Provider, MD  Brief narrative: 73 y.o. M from [place], brought in by EMS for hypotension, progressive lethargy over the past 2 weeks, and fever to 103 day of admit. Initial BP in ER in 95'A systolic. PCCM asked to admit.  Assessment/Plan:    Severe sepsis with septic shock -sepsis physiology resolved -etiology UTI (see below) -mild elevation lactic acid compounded by concomitant use of Metformin pre admit -cont supportive care -now on Primaxin day 3/10    Atrial fibrillation with RVR -onset while in shock and DH-possible BB withdrawal -convert to NSR with IV Amiodarone now off -rate improved, increased PO metoprolol     ESBL producing bacteria infection/ E. 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 Due to UTI and anbx's, malabsorption -try Immodium    Dehydration with hypernatremia -resolved but poor oral intake noted so follow closely    History of stroke/Dementia -cont Plavix -cont Aricept    Protein-calorie malnutrition, severe -poor intake- with CVA history    HOCM (hypertrophic obstructive cardiomyopathy) -stable   Bilateral initial tuberosity pressure ulcers/unstable -WOC RN following- present before admission    BPH - continue flomax, foley Dced   DVT prophylaxis: Cutaneous heparin Code Status: DO NOT RESUSCITATE Family Communication: No family at bedside, called and d/w sister yesterday Disposition Plan/Expected LOS: LTAC vs SNF   Consultants: Critical care medicine  Procedures: None  CULTURES:  Blood x 2 2/24 >>> no growth to date Urine  2/24 >>> E. ESBL Escherichia coli sensitive to Zosyn and Primaxin C diff 2/25 >>> negative  Antibiotics: Zosyn 2/24 >>> Vancomycin 2/24 >>> 2/26  HPI/Subjective: Alert. No complaints, still with loose stools  Objective: Blood pressure 99/50, pulse 72, temperature 98.3 F (36.8 C), temperature source Oral, resp. rate 16, height 5\' 7"  (1.702 m), weight 66.996 kg (147 lb 11.2 oz), SpO2 98.00%.  Intake/Output Summary (Last 24 hours) at 02/13/14 1412 Last data filed at 02/13/14 1259  Gross per 24 hour  Intake   1680 ml  Output    950 ml  Net    730 ml     Exam: General: No acute respiratory distress, alert, awake, more lucid today, frail Lungs: Clear to auscultation bilaterally without wheezes or crackles, RA Cardiovascular: Regular rate and rhythm without murmur gallop or rub normal S1 and S2, no peripheral edema or JVD Abdomen: Nontender, nondistended, soft, bowel sounds positive, no rebound, no ascites, no appreciable mass, rectal tube in place with liquid light brown returns noted Musculoskeletal: No significant cyanosis, clubbing of bilateral lower extremities Neurological: Alert and oriented x name only noting limited verbal response to questions asked-?? 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 aspirin EC  81 mg Oral Daily  . atorvastatin  20 mg Oral q1800  . clopidogrel  75 mg Oral Q breakfast  . collagenase   Topical Daily  . donepezil  10 mg Oral QHS  . heparin  5,000 Units Subcutaneous 3 times per day  . imipenem-cilastatin  250 mg Intravenous 4 times per day  .  insulin aspart  0-15 Units Subcutaneous TID WC  . insulin aspart  0-5 Units Subcutaneous QHS  . linagliptin  5 mg Oral Daily  . metoprolol tartrate  12.5 mg Oral BID  . tamsulosin  0.4 mg Oral QPC supper   Continuous Infusions: . dextrose 75 mL (02/11/14 1034)    Data Reviewed: Basic Metabolic Panel:  Recent  Labs Lab 02/10/14 1315  02/11/14 0500 02/11/14 1155 02/11/14 2300 02/12/14 0520 02/12/14 1055 02/13/14 0545  NA 155*  --  154* 153* 146 145 144 143  K 5.0  --  3.6* 4.0 3.6* 3.0* 3.4* 3.5*  CL 116*  --  124* 120* 112 111 111 111  CO2 17*  --  18* 19 20 22 20 22   GLUCOSE 279*  --  138* 161* 210* 203* 227* 194*  BUN 93*  --  54* 45* 33* 30* 27* 19  CREATININE 2.77*  < > 1.42* 1.17 1.06 1.02 0.97 0.83  CALCIUM 8.6  --  7.1* 7.3* 7.3* 7.3* 7.3* 7.4*  MG  --   --  1.7  --   --   --   --   --   PHOS  --   --  2.5  --   --   --   --   --   < > = values in this interval not displayed. Liver Function Tests:  Recent Labs Lab 02/10/14 1315  AST 555*  ALT 285*  ALKPHOS 58  BILITOT 1.6*  PROT 8.0  ALBUMIN 2.4*   No results found for this basename: LIPASE, AMYLASE,  in the last 168 hours No results found for this basename: AMMONIA,  in the last 168 hours CBC:  Recent Labs Lab 02/10/14 1315 02/10/14 1721 02/13/14 0545  WBC 15.1* 13.1* 6.0  HGB 14.4 14.0 11.3*  HCT 43.1 42.1 34.2*  MCV 98.2 98.4 94.5  PLT 257 216 158   Cardiac Enzymes:  Recent Labs Lab 02/10/14 1316  TROPONINI <0.30   BNP (last 3 results) No results found for this basename: PROBNP,  in the last 8760 hours CBG:  Recent Labs Lab 02/12/14 1155 02/12/14 1645 02/12/14 2221 02/13/14 0806 02/13/14 1144  GLUCAP 151* 164* 172* 196* 243*    Recent Results (from the past 240 hour(s))  CULTURE, BLOOD (ROUTINE X 2)     Status: None   Collection Time    02/10/14  2:40 PM      Result Value Ref Range Status   Specimen Description BLOOD ARM LEFT   Final   Special Requests BOTTLES DRAWN AEROBIC AND ANAEROBIC 6CC   Final   Culture  Setup Time     Final   Value: 02/10/2014 22:10     Performed at [facility]   Culture     Final   Value:        BLOOD CULTURE RECEIVED NO GROWTH TO DATE CULTURE WILL BE HELD FOR 5 DAYS BEFORE ISSUING A FINAL NEGATIVE REPORT     Performed at [facility]    Report Status PENDING   Incomplete  URINE CULTURE     Status: None   Collection Time    02/10/14  3:37 PM      Result Value Ref Range Status   Specimen Description URINE, CATHETERIZED   Final   Special Requests NONE   Final   Culture  Setup Time     Final   Value: 02/10/2014 16:32     Performed at [facility]  Colony Count     Final   Value: 65,000 COLONIES/ML     Performed at [facility]   Culture     Final   Value: ESCHERICHIA COLI     Note: Confirmed Extended Spectrum Beta-Lactamase Producer (ESBL) CRITICAL RESULT CALLED TO, READ BACK BY AND VERIFIED WITH: JUDITH KAOME AT 6:01 A.M. ON 02/12/14 WARRB     Performed at [facility]   Report Status 02/12/2014 FINAL   Final   Organism ID, Bacteria ESCHERICHIA COLI   Final  CULTURE, BLOOD (ROUTINE X 2)     Status: None   Collection Time    02/10/14  5:21 PM      Result Value Ref Range Status   Specimen Description BLOOD HAND LEFT   Final   Special Requests BOTTLES DRAWN AEROBIC AND ANAEROBIC B 4CC R 3CC   Final   Culture  Setup Time     Final   Value: 02/10/2014 22:10     Performed at [facility]   Culture     Final   Value:        BLOOD CULTURE RECEIVED NO GROWTH TO DATE CULTURE WILL BE HELD FOR 5 DAYS BEFORE ISSUING A FINAL NEGATIVE REPORT     Performed at [facility]   Report Status PENDING   Incomplete  MRSA PCR SCREENING     Status: None   Collection Time    02/10/14  5:50 PM      Result Value Ref Range Status   MRSA by PCR NEGATIVE  NEGATIVE Final   Comment:            The GeneXpert MRSA Assay (FDA     approved for NASAL specimens     only), is one component of a     comprehensive MRSA colonization     surveillance program. It is not     intended to diagnose MRSA     infection nor to guide or     monitor treatment for     MRSA infections.  CLOSTRIDIUM DIFFICILE BY PCR     Status: None   Collection Time    02/11/14 12:15 PM      Result Value Ref Range Status   C  difficile by pcr NEGATIVE  NEGATIVE Final    02/13/2014, 2:12 PM   LOS: 3 days   Latashia Koch,MD [phone]  02/13/2014, 2:12 PM

## 2014-02-15 DIAGNOSIS — E870 Hyperosmolality and hypernatremia: Secondary | ICD-10-CM

## 2014-02-15 LAB — GLUCOSE, CAPILLARY
Glucose-Capillary: 168 mg/dL — ABNORMAL HIGH (ref 70–99)
Glucose-Capillary: 220 mg/dL — ABNORMAL HIGH (ref 70–99)
Glucose-Capillary: 173 mg/dL — ABNORMAL HIGH (ref 70–99)
Glucose-Capillary: 171 mg/dL — ABNORMAL HIGH (ref 70–99)

## 2014-02-15 MED ORDER — CHOLESTYRAMINE 4 G PO PACK
4.0000 g | PACK | Freq: Two times a day (BID) | ORAL | Status: DC
  Administered 2014-02-15 – 2014-02-16 (×4): 4 g via ORAL
  Filled 2014-02-15 (×6): qty 1

## 2014-02-15 MED ORDER — IBUPROFEN 400 MG PO TABS
400.0000 mg | ORAL_TABLET | Freq: Once | ORAL | Status: AC
  Administered 2014-02-15: 400 mg via ORAL
  Filled 2014-02-15: qty 1

## 2014-02-15 MED ORDER — LOPERAMIDE HCL 2 MG PO CAPS
2.0000 mg | ORAL_CAPSULE | Freq: Three times a day (TID) | ORAL | Status: DC
  Administered 2014-02-15 – 2014-02-17 (×6): 2 mg via ORAL
  Filled 2014-02-15 (×6): qty 1

## 2014-02-15 NOTE — Progress Notes (Signed)
PCCM and Team 1 transfer  Jerry Stanley [id number] DOB: October 09, 1941 DOA: 02/10/2014 PCP: Default, Provider, MD  Brief narrative: 73 y.o. M from [place], brought in by EMS for hypotension, progressive lethargy over the past 2 weeks, and fever to 103 day of admit. Initial BP in ER in 0000000 systolic. PCCM asked to admit.  Assessment/Plan:    Severe sepsis with septic shock -required pressors initially -sepsis physiology resolved -etiology UTI (see below) -mild elevation lactic acid compounded by concomitant use of Metformin pre admit -now on Primaxin day 4/10    Atrial fibrillation with RVR -onset while in shock and DH-possible BB withdrawal -convert to NSR with IV Amiodarone now off -rate improved, increased PO metoprolol     ESBL producing bacteria infection/ E. 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 Due to UTI and anbx's, malabsorption -increase immodium, add Questran    Dehydration with hypernatremia -resolved but poor oral intake noted so follow closely -continue IVF till diarrhea resolved    History of stroke/Dementia -cont Plavix -cont Aricept    Protein-calorie malnutrition, severe -poor intake- with CVA history    HOCM (hypertrophic obstructive cardiomyopathy) -stable   Bilateral initial tuberosity pressure ulcers/unstable -WOC RN following- present before admission    BPH - continue flomax, foley Dced  DVT prophylaxis: Cutaneous heparin Code Status: DO NOT RESUSCITATE Family Communication: No family at bedside, called and d/w sister 2/27 Disposition Plan/Expected LOS: LTAC vs SNF  Consultants: Critical care  medicine  Procedures: None  CULTURES:  Blood x 2 2/24 >>> no growth to date Urine 2/24 >>> E. ESBL Escherichia coli sensitive to Zosyn and Primaxin C diff 2/25 >>> negative  Antibiotics: Zosyn 2/24 >>> Vancomycin 2/24 >>> 2/26  HPI/Subjective: Alert. No complaints, still with loose stools  Objective: Blood pressure 99/50, pulse 72, temperature 98.3 F (36.8 C), temperature source Oral, resp. rate 16, height 5\' 7"  (1.702 m), weight 66.996 kg (147 lb 11.2 oz), SpO2 98.00%.  Intake/Output Summary (Last 24 hours) at 02/13/14 1412 Last data filed at 02/13/14 1259  Gross per 24 hour  Intake   1680 ml  Output    950 ml  Net    730 ml     Exam: General: No acute respiratory distress, alert, awake, more lucid today, frail Lungs: Clear to auscultation bilaterally without wheezes or crackles, RA Cardiovascular: Regular rate and rhythm without murmur gallop or rub normal S1 and S2, no peripheral edema or JVD Abdomen: Nontender, nondistended, soft, bowel sounds positive, no rebound, no ascites, no appreciable mass, rectal tube in place with liquid light brown returns noted Musculoskeletal: No significant cyanosis, clubbing of bilateral lower extremities Neurological: Alert and oriented x name only noting limited verbal response to questions asked-?? 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 aspirin EC  81 mg Oral Daily  . atorvastatin  20 mg Oral q1800  . clopidogrel  75 mg Oral Q breakfast  . collagenase   Topical Daily  . donepezil  10 mg Oral QHS  . heparin  5,000 Units Subcutaneous 3 times per day  . imipenem-cilastatin  250 mg Intravenous  4 times per day  . insulin aspart  0-15 Units Subcutaneous TID WC  . insulin aspart  0-5 Units Subcutaneous QHS  . linagliptin  5 mg Oral Daily  . metoprolol tartrate  12.5 mg Oral BID  . tamsulosin  0.4 mg Oral QPC supper   Continuous Infusions: .  dextrose 75 mL (02/11/14 1034)    Data Reviewed: Basic Metabolic Panel:  Recent Labs Lab 02/10/14 1315  02/11/14 0500 02/11/14 1155 02/11/14 2300 02/12/14 0520 02/12/14 1055 02/13/14 0545  NA 155*  --  154* 153* 146 145 144 143  K 5.0  --  3.6* 4.0 3.6* 3.0* 3.4* 3.5*  CL 116*  --  124* 120* 112 111 111 111  CO2 17*  --  18* 19 20 22 20 22   GLUCOSE 279*  --  138* 161* 210* 203* 227* 194*  BUN 93*  --  54* 45* 33* 30* 27* 19  CREATININE 2.77*  < > 1.42* 1.17 1.06 1.02 0.97 0.83  CALCIUM 8.6  --  7.1* 7.3* 7.3* 7.3* 7.3* 7.4*  MG  --   --  1.7  --   --   --   --   --   PHOS  --   --  2.5  --   --   --   --   --   < > = values in this interval not displayed. Liver Function Tests:  Recent Labs Lab 02/10/14 1315  AST 555*  ALT 285*  ALKPHOS 58  BILITOT 1.6*  PROT 8.0  ALBUMIN 2.4*   No results found for this basename: LIPASE, AMYLASE,  in the last 168 hours No results found for this basename: AMMONIA,  in the last 168 hours CBC:  Recent Labs Lab 02/10/14 1315 02/10/14 1721 02/13/14 0545  WBC 15.1* 13.1* 6.0  HGB 14.4 14.0 11.3*  HCT 43.1 42.1 34.2*  MCV 98.2 98.4 94.5  PLT 257 216 158   Cardiac Enzymes:  Recent Labs Lab 02/10/14 1316  TROPONINI <0.30   BNP (last 3 results) No results found for this basename: PROBNP,  in the last 8760 hours CBG:  Recent Labs Lab 02/12/14 1155 02/12/14 1645 02/12/14 2221 02/13/14 0806 02/13/14 1144  GLUCAP 151* 164* 172* 196* 243*    Recent Results (from the past 240 hour(s))  CULTURE, BLOOD (ROUTINE X 2)     Status: None   Collection Time    02/10/14  2:40 PM      Result Value Ref Range Status   Specimen Description BLOOD ARM LEFT   Final   Special Requests BOTTLES DRAWN AEROBIC AND ANAEROBIC 6CC   Final   Culture  Setup Time     Final   Value: 02/10/2014 22:10     Performed at [facility]   Culture     Final   Value:        BLOOD CULTURE RECEIVED NO GROWTH TO DATE CULTURE WILL BE HELD FOR 5 DAYS  BEFORE ISSUING A FINAL NEGATIVE REPORT     Performed at [facility]   Report Status PENDING   Incomplete  URINE CULTURE     Status: None   Collection Time    02/10/14  3:37 PM      Result Value Ref Range Status   Specimen Description URINE, CATHETERIZED   Final   Special Requests NONE   Final   Culture  Setup Time     Final   Value: 02/10/2014 16:32  Performed at [facility]     Final   Value: 65,000 COLONIES/ML     Performed at [facility]   Culture     Final   Value: ESCHERICHIA COLI     Note: Confirmed Extended Spectrum Beta-Lactamase Producer (ESBL) CRITICAL RESULT CALLED TO, READ BACK BY AND VERIFIED WITH: JUDITH KAOME AT 6:01 A.M. ON 02/12/14 WARRB     Performed at [facility]   Report Status 02/12/2014 FINAL   Final   Organism ID, Bacteria ESCHERICHIA COLI   Final  CULTURE, BLOOD (ROUTINE X 2)     Status: None   Collection Time    02/10/14  5:21 PM      Result Value Ref Range Status   Specimen Description BLOOD HAND LEFT   Final   Special Requests BOTTLES DRAWN AEROBIC AND ANAEROBIC B 4CC R 3CC   Final   Culture  Setup Time     Final   Value: 02/10/2014 22:10     Performed at [facility]   Culture     Final   Value:        BLOOD CULTURE RECEIVED NO GROWTH TO DATE CULTURE WILL BE HELD FOR 5 DAYS BEFORE ISSUING A FINAL NEGATIVE REPORT     Performed at [facility]   Report Status PENDING   Incomplete  MRSA PCR SCREENING     Status: None   Collection Time    02/10/14  5:50 PM      Result Value Ref Range Status   MRSA by PCR NEGATIVE  NEGATIVE Final   Comment:            The GeneXpert MRSA Assay (FDA     approved for NASAL specimens     only), is one component of a     comprehensive MRSA colonization     surveillance program. It is not     intended to diagnose MRSA     infection nor to guide or     monitor treatment for     MRSA infections.  CLOSTRIDIUM DIFFICILE BY PCR     Status: None    Collection Time    02/11/14 12:15 PM      Result Value Ref Range Status   C difficile by pcr NEGATIVE  NEGATIVE Final    02/13/2014, 2:12 PM   LOS: 3 days   Isador Castille,MD [phone]  02/13/2014, 2:12 PM

## 2014-02-15 NOTE — Progress Notes (Signed)
Pt temp 101.3 this pm with IV primaxin Q6hrs.  Sent Kathline Magic, NP text page notification.

## 2014-02-16 LAB — GLUCOSE, CAPILLARY
Glucose-Capillary: 160 mg/dL — ABNORMAL HIGH (ref 70–99)
Glucose-Capillary: 161 mg/dL — ABNORMAL HIGH (ref 70–99)
Glucose-Capillary: 198 mg/dL — ABNORMAL HIGH (ref 70–99)
Glucose-Capillary: 192 mg/dL — ABNORMAL HIGH (ref 70–99)

## 2014-02-16 LAB — CULTURE, BLOOD (ROUTINE X 2)
CULTURE: NO GROWTH
Culture: NO GROWTH

## 2014-02-16 LAB — CBC
HEMATOCRIT: 31.4 % — AB (ref 39.0–52.0)
Hemoglobin: 10.6 g/dL — ABNORMAL LOW (ref 13.0–17.0)
MCH: 31.5 pg (ref 26.0–34.0)
MCHC: 33.8 g/dL (ref 30.0–36.0)
MCV: 93.5 fL (ref 78.0–100.0)
Platelets: 125 10*3/uL — ABNORMAL LOW (ref 150–400)
RBC: 3.36 MIL/uL — ABNORMAL LOW (ref 4.22–5.81)
RDW: 13.6 % (ref 11.5–15.5)
WBC: 8.3 10*3/uL (ref 4.0–10.5)

## 2014-02-16 LAB — BASIC METABOLIC PANEL
BUN: 14 mg/dL (ref 6–23)
CHLORIDE: 112 meq/L (ref 96–112)
CO2: 21 mEq/L (ref 19–32)
CREATININE: 0.88 mg/dL (ref 0.50–1.35)
Calcium: 6.8 mg/dL — ABNORMAL LOW (ref 8.4–10.5)
GFR calc non Af Amer: 83 mL/min — ABNORMAL LOW (ref >90)
Glucose, Bld: 151 mg/dL — ABNORMAL HIGH (ref 70–99)
Potassium: 3 mEq/L — ABNORMAL LOW (ref 3.7–5.3)
Sodium: 144 mEq/L (ref 137–147)

## 2014-02-16 MED ORDER — POTASSIUM CHLORIDE 10 MEQ/100ML IV SOLN
10.0000 meq | INTRAVENOUS | Status: AC
  Administered 2014-02-16 – 2014-02-17 (×3): 10 meq via INTRAVENOUS
  Filled 2014-02-16 (×3): qty 100

## 2014-02-16 NOTE — Progress Notes (Signed)
Late Entry - Pt's am K and Ca level called to NP on call with new orders rec'd.  IV KCL currently infusing as ordered.  Will continue to monitor closely.

## 2014-02-16 NOTE — Progress Notes (Signed)
PCCM and Team 1 transfer  Ras Kollman PYP:[id number] DOB: 10-01-1941 DOA: 02/10/2014 PCP: Default, Provider, MD  Brief narrative: 73 y.o. M from [place], brought in by EMS for hypotension, progressive lethargy over the past 2 weeks, and fever to 103 day of admit. Initial BP in ER in 24'P systolic. PCCM admitted her and started her on pressors.  Assessment/Plan:    Severe sepsis with septic shock - required pressors initially - sepsis physiology resolved - etiology UTI (see below) - mild elevation lactic acid compounded by concomitant use of Metformin pre admit - now on Primaxin day 5/10    Atrial fibrillation with RVR - onset while in shock and DH-possible BB withdrawal - convert to NSR with IV Amiodarone now off - rate improved, increased PO metoprolol     ESBL producing bacteria infection/ E. coli UTI -stopped Zosyn in favor Primaxin 2/26 - Vancomycin stopped 2/26    Hypertension with hypotension on admit - BP improving - titrate up BB, diovan on hold - resumed Flomax - foley Dced    Diabetes mellitus type 2 in non obese - improved - continue SSI - holding home metformin for now    Diarrhea -cont flexiseal -c diff negative -?? Due to UTI and anbx's, malabsorption -increase immodium, continue Questran - check Gi pathogen panel    Dehydration with hypernatremia -resolved but poor oral intake noted so follow closely -continue IVF at slow rate till diarrhea resolved    History of stroke/Dementia -cont Plavix -cont Aricept    Protein-calorie malnutrition, severe -poor intake- with CVA history    HOCM (hypertrophic obstructive cardiomyopathy) -stable   Bilateral initial tuberosity pressure ulcers/unstable -WOC RN following- present before admission -continue wound care    BPH - continue flomax, foley Dced  DVT prophylaxis: heparin SQ  Code Status: DO NOT RESUSCITATE  Family Communication: No family at bedside, called and d/w sister  2/27 Disposition Plan/Expected LOS: LTAC vs SNF  Consultants: Critical care medicine  Procedures: None  CULTURES:  Blood x 2 2/24 >>> no growth to date Urine 2/24 >>> E. ESBL Escherichia coli sensitive to Zosyn and Primaxin C diff 2/25 >>> negative  Antibiotics: Zosyn 2/24 >>> Vancomycin 2/24 >>> 2/26  HPI/Subjective: Alert. No complaints, still with loose stools  Objective: Blood pressure 99/50, pulse 72, temperature 98.3 F (36.8 C), temperature source Oral, resp. rate 16, height 5\' 7"  (1.702 m), weight 66.996 kg (147 lb 11.2 oz), SpO2 98.00%.  Intake/Output Summary (Last 24 hours) at 02/13/14 1412 Last data filed at 02/13/14 1259  Gross per 24 hour  Intake   1680 ml  Output    950 ml  Net    730 ml     Exam: General: No acute respiratory distress, alert, awake, more lucid today, frail Lungs: Clear to auscultation bilaterally without wheezes or crackles, RA Cardiovascular: Regular rate and rhythm without murmur gallop or rub normal S1 and S2, no peripheral edema or JVD Abdomen: Nontender, nondistended, soft, bowel sounds positive, no rebound, no ascites, no appreciable mass, rectal tube in place with liquid light brown returns noted Musculoskeletal: No significant cyanosis, clubbing of bilateral lower extremities Neurological: Alert and oriented x name only noting limited verbal response to questions asked-?? 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 aspirin EC  81 mg Oral Daily  . atorvastatin  20 mg Oral q1800  . clopidogrel  75 mg Oral Q breakfast  . collagenase  Topical Daily  . donepezil  10 mg Oral QHS  . heparin  5,000 Units Subcutaneous 3 times per day  . imipenem-cilastatin  250 mg Intravenous 4 times per day  . insulin aspart  0-15 Units Subcutaneous TID WC  . insulin aspart  0-5 Units Subcutaneous QHS  . linagliptin  5 mg Oral Daily  . metoprolol tartrate  12.5 mg  Oral BID  . tamsulosin  0.4 mg Oral QPC supper   Continuous Infusions: . dextrose 75 mL (02/11/14 1034)    Data Reviewed: Basic Metabolic Panel:  Recent Labs Lab 02/10/14 1315  02/11/14 0500 02/11/14 1155 02/11/14 2300 02/12/14 0520 02/12/14 1055 02/13/14 0545  NA 155*  --  154* 153* 146 145 144 143  K 5.0  --  3.6* 4.0 3.6* 3.0* 3.4* 3.5*  CL 116*  --  124* 120* 112 111 111 111  CO2 17*  --  18* 19 20 22 20 22   GLUCOSE 279*  --  138* 161* 210* 203* 227* 194*  BUN 93*  --  54* 45* 33* 30* 27* 19  CREATININE 2.77*  < > 1.42* 1.17 1.06 1.02 0.97 0.83  CALCIUM 8.6  --  7.1* 7.3* 7.3* 7.3* 7.3* 7.4*  MG  --   --  1.7  --   --   --   --   --   PHOS  --   --  2.5  --   --   --   --   --   < > = values in this interval not displayed. Liver Function Tests:  Recent Labs Lab 02/10/14 1315  AST 555*  ALT 285*  ALKPHOS 58  BILITOT 1.6*  PROT 8.0  ALBUMIN 2.4*   No results found for this basename: LIPASE, AMYLASE,  in the last 168 hours No results found for this basename: AMMONIA,  in the last 168 hours CBC:  Recent Labs Lab 02/10/14 1315 02/10/14 1721 02/13/14 0545  WBC 15.1* 13.1* 6.0  HGB 14.4 14.0 11.3*  HCT 43.1 42.1 34.2*  MCV 98.2 98.4 94.5  PLT 257 216 158   Cardiac Enzymes:  Recent Labs Lab 02/10/14 1316  TROPONINI <0.30   BNP (last 3 results) No results found for this basename: PROBNP,  in the last 8760 hours CBG:  Recent Labs Lab 02/12/14 1155 02/12/14 1645 02/12/14 2221 02/13/14 0806 02/13/14 1144  GLUCAP 151* 164* 172* 196* 243*    Recent Results (from the past 240 hour(s))  CULTURE, BLOOD (ROUTINE X 2)     Status: None   Collection Time    02/10/14  2:40 PM      Result Value Ref Range Status   Specimen Description BLOOD ARM LEFT   Final   Special Requests BOTTLES DRAWN AEROBIC AND ANAEROBIC 6CC   Final   Culture  Setup Time     Final   Value: 02/10/2014 22:10     Performed at [facility]   Culture     Final   Value:         BLOOD CULTURE RECEIVED NO GROWTH TO DATE CULTURE WILL BE HELD FOR 5 DAYS BEFORE ISSUING A FINAL NEGATIVE REPORT     Performed at [facility]   Report Status PENDING   Incomplete  URINE CULTURE     Status: None   Collection Time    02/10/14  3:37 PM      Result Value Ref Range Status   Specimen Description URINE, CATHETERIZED  Final   Special Requests NONE   Final   Culture  Setup Time     Final   Value: 02/10/2014 16:32     Performed at [place]     Final   Value: 65,000 COLONIES/ML     Performed at [facility]   Culture     Final   Value: ESCHERICHIA COLI     Note: Confirmed Extended Spectrum Beta-Lactamase Producer (ESBL) CRITICAL RESULT CALLED TO, READ BACK BY AND VERIFIED WITH: JUDITH KAOME AT 6:01 A.M. ON 02/12/14 WARRB     Performed at [facility]   Report Status 02/12/2014 FINAL   Final   Organism ID, Bacteria ESCHERICHIA COLI   Final  CULTURE, BLOOD (ROUTINE X 2)     Status: None   Collection Time    02/10/14  5:21 PM      Result Value Ref Range Status   Specimen Description BLOOD HAND LEFT   Final   Special Requests BOTTLES DRAWN AEROBIC AND ANAEROBIC B 4CC R 3CC   Final   Culture  Setup Time     Final   Value: 02/10/2014 22:10     Performed at [facility]   Culture     Final   Value:        BLOOD CULTURE RECEIVED NO GROWTH TO DATE CULTURE WILL BE HELD FOR 5 DAYS BEFORE ISSUING A FINAL NEGATIVE REPORT     Performed at [facility]   Report Status PENDING   Incomplete  MRSA PCR SCREENING     Status: None   Collection Time    02/10/14  5:50 PM      Result Value Ref Range Status   MRSA by PCR NEGATIVE  NEGATIVE Final   Comment:            The GeneXpert MRSA Assay (FDA     approved for NASAL specimens     only), is one component of a     comprehensive MRSA colonization     surveillance program. It is not     intended to diagnose MRSA     infection nor to guide or     monitor treatment for      MRSA infections.  CLOSTRIDIUM DIFFICILE BY PCR     Status: None   Collection Time    02/11/14 12:15 PM      Result Value Ref Range Status   C difficile by pcr NEGATIVE  NEGATIVE Final    02/13/2014, 2:12 PM   LOS: 3 days   Turner Baillie,MD [phone]  02/13/2014, 2:12 PM

## 2014-02-16 NOTE — Progress Notes (Signed)
Late Entry - 400 mg ibuprofen given for pt's fever per orders.  Pt without complaints at present.

## 2014-02-16 NOTE — Progress Notes (Signed)
ANTIBIOTIC CONSULT NOTE - FOLLOW UP  Pharmacy Consult for Primaxin Indication: ESBL E coli UTI  No Known Allergies  Patient Measurements: Height: 5\' 7"  (170.2 cm) Weight: 147 lb 6.4 oz (66.86 kg) IBW/kg (Calculated) : 66.1  Vital Signs: Temp: 99.7 F (37.6 C) (03/02 0530) Temp src: Oral (03/02 0530) BP: 145/60 mmHg (03/02 0530) Pulse Rate: 59 (03/02 0530) Intake/Output from previous day: 03/01 0701 - 03/02 0700 In: 970 [P.O.:600; I.V.:270; IV Piggyback:100] Out: 550 [Urine:300; Stool:250]  Labs:  Recent Labs  02/16/14 0505  WBC 8.3  HGB 10.6*  PLT 125*  CREATININE 0.88   Estimated Creatinine Clearance: 69.9 ml/min (by C-G formula based on Cr of 0.88).  Microbiology: Recent Results (from the past 720 hour(s))  CULTURE, BLOOD (ROUTINE X 2)     Status: None   Collection Time    02/10/14  2:40 PM      Result Value Ref Range Status   Specimen Description BLOOD ARM LEFT   Final   Special Requests BOTTLES DRAWN AEROBIC AND ANAEROBIC [facility]   Final   Culture  Setup Time     Final   Value: 02/10/2014 22:10     Performed at [facility]   Culture     Final   Value: NO GROWTH 5 DAYS     Performed at [facility]   Report Status 02/16/2014 FINAL   Final  URINE CULTURE     Status: None   Collection Time    02/10/14  3:37 PM      Result Value Ref Range Status   Specimen Description URINE, CATHETERIZED   Final   Special Requests NONE   Final   Culture  Setup Time     Final   Value: 02/10/2014 16:32     Performed at [facility]     Final   Value: 65,000 COLONIES/ML     Performed at [facility]   Culture     Final   Value: ESCHERICHIA COLI     Note: Confirmed Extended Spectrum Beta-Lactamase Producer (ESBL) CRITICAL RESULT CALLED TO, READ BACK BY AND VERIFIED WITH: JUDITH KAOME AT 6:01 A.M. ON 02/12/14 WARRB     Performed at [facility]   Report Status 02/12/2014 FINAL   Final   Organism ID, Bacteria ESCHERICHIA  COLI   Final  CULTURE, BLOOD (ROUTINE X 2)     Status: None   Collection Time    02/10/14  5:21 PM      Result Value Ref Range Status   Specimen Description BLOOD HAND LEFT   Final   Special Requests BOTTLES DRAWN AEROBIC AND ANAEROBIC B 4CC R 3CC   Final   Culture  Setup Time     Final   Value: 02/10/2014 22:10     Performed at [facility]   Culture     Final   Value: NO GROWTH 5 DAYS     Performed at [facility]   Report Status 02/16/2014 FINAL   Final  MRSA PCR SCREENING     Status: None   Collection Time    02/10/14  5:50 PM      Result Value Ref Range Status   MRSA by PCR NEGATIVE  NEGATIVE Final   Comment:            The GeneXpert MRSA Assay (FDA     approved for NASAL specimens     only), is one component of  a     comprehensive MRSA colonization     surveillance program. It is not     intended to diagnose MRSA     infection nor to guide or     monitor treatment for     MRSA infections.  CLOSTRIDIUM DIFFICILE BY PCR     Status: None   Collection Time    02/11/14 12:15 PM      Result Value Ref Range Status   C difficile by pcr NEGATIVE  NEGATIVE Final   Assessment:  Changing from Vancomycin and Zosyn to Primaxin for ESBL E coli UTI.  52 K/ml colonies in cult  Day 5 of 10 of Primaxin therapy -- renal function has improved  Goal of Therapy:  appropriate Primaxin dose for renal function and infection  Plan:   Increase Primaxin to 500 mg iv Q 8 hours  Will follow renal function for any need to adjust regimen.  Thank you. Anette Guarneri, PharmD [phone] 02/16/2014,11:53 AM

## 2014-02-16 NOTE — Progress Notes (Signed)
Dr Broadus John verbally notified on rounds that patient potassium 3.0 and calcium 6.8. Dr Broadus John to see patient. Dorna Bloom, RN

## 2014-02-17 ENCOUNTER — Encounter: Payer: Self-pay | Admitting: Nurse Practitioner

## 2014-02-17 LAB — COMPREHENSIVE METABOLIC PANEL
ALT: 38 U/L (ref 0–53)
AST: 44 U/L — ABNORMAL HIGH (ref 0–37)
Albumin: 1.6 g/dL — ABNORMAL LOW (ref 3.5–5.2)
Alkaline Phosphatase: 52 U/L (ref 39–117)
BUN: 13 mg/dL (ref 6–23)
CO2: 20 mEq/L (ref 19–32)
Calcium: 6.9 mg/dL — ABNORMAL LOW (ref 8.4–10.5)
Chloride: 116 mEq/L — ABNORMAL HIGH (ref 96–112)
Creatinine, Ser: 0.76 mg/dL (ref 0.50–1.35)
GFR calc Af Amer: >90 mL/min (ref >90)
GFR calc non Af Amer: 88 mL/min — ABNORMAL LOW (ref >90)
Glucose, Bld: 174 mg/dL — ABNORMAL HIGH (ref 70–99)
Potassium: 3.2 mEq/L — ABNORMAL LOW (ref 3.7–5.3)
Sodium: 148 mEq/L — ABNORMAL HIGH (ref 137–147)
Total Bilirubin: 0.5 mg/dL (ref 0.3–1.2)
Total Protein: 5.9 g/dL — ABNORMAL LOW (ref 6.0–8.3)

## 2014-02-17 LAB — GI PATHOGEN PANEL BY PCR, STOOL
C difficile toxin A/B: NEGATIVE
Campylobacter by PCR: NEGATIVE
Cryptosporidium by PCR: NEGATIVE
E coli (ETEC) LT/ST: NEGATIVE
E coli (STEC): NEGATIVE
E coli 0157 by PCR: NEGATIVE
G LAMBLIA BY PCR: NEGATIVE
NOROVIRUS G1/G2: NEGATIVE
Rotavirus A by PCR: POSITIVE
SHIGELLA BY PCR: NEGATIVE
Salmonella by PCR: NEGATIVE

## 2014-02-17 LAB — GLUCOSE, CAPILLARY
GLUCOSE-CAPILLARY: 147 mg/dL — AB (ref 70–99)
Glucose-Capillary: 175 mg/dL — ABNORMAL HIGH (ref 70–99)
Glucose-Capillary: 159 mg/dL — ABNORMAL HIGH (ref 70–99)

## 2014-02-17 MED ORDER — LOPERAMIDE HCL 2 MG PO CAPS: 2.0000 mg | ORAL_CAPSULE | Freq: Three times a day (TID) | ORAL | Status: DC

## 2014-02-17 MED ORDER — CHOLESTYRAMINE 4 G PO PACK: 4.0000 g | PACK | Freq: Two times a day (BID) | ORAL | Status: DC

## 2014-02-17 MED ORDER — SODIUM CHLORIDE 0.9 % IV SOLN
500.0000 mg | Freq: Three times a day (TID) | INTRAVENOUS | Status: DC
  Administered 2014-02-17: 500 mg via INTRAVENOUS
  Filled 2014-02-17 (×3): qty 500

## 2014-02-17 NOTE — Progress Notes (Signed)
This encounter was created in error - please disregard.  This encounter was created in error - please disregard.

## 2014-02-17 NOTE — Progress Notes (Signed)
Speech Language Pathology Treatment: Dysphagia  Patient Details Name: Jerry Stanley MRN: [id number] DOB: July 07, 1941 Today's Date: 02/17/2014 Time: 1820-9906 SLP Time Calculation (min): 26 min  Assessment / Plan / Recommendation Clinical Impression  Skilled SLP for f/u assessment of diet tolerance and possible diet advancement.  Patient took multiple large consecutive swallows of thin liquids via straw without overt s/s of aspiration.  Pt. Chews and swallows soft solids without difficulty.   Low grade temp. Noted (99.6), LLS diminished, ULS clear; CXR 02/14/14 No acute cardiopulmonary process.  Patient appears to be tolerating the current diet.  Will not advance at this time, but defer to SNF SLP.   HPI HPI: 73 y.o. M from [place], brought in by EMS for hypotension, progressive lethargy over the past 2 weeks, and fever to 103 day of admit.  Initial BP in ER in 89'N systolic.  PCCM asked to admit.   Pertinent Vitals See above.  SLP Plan  Discharge SLP treatment due to (comment);All goals met    Recommendations Diet recommendations: Dysphagia 3 (mechanical soft);Thin liquid Liquids provided via: Cup;Straw Medication Administration: Whole meds with puree Supervision: Staff to assist with self feeding;Full supervision/cueing for compensatory strategies Compensations: Slow rate;Small sips/bites;Check for pocketing Postural Changes and/or Swallow Maneuvers: Seated upright 90 degrees              Oral Care Recommendations: Oral care BID Follow up Recommendations: Skilled Nursing facility Plan: Discharge SLP treatment due to (comment);All goals met    GO     Jerry Stanley 02/17/2014, 3:26 PM

## 2014-02-17 NOTE — Progress Notes (Signed)
Notified sister Delaine of transport to [facility]. Carroll Kinds RN

## 2014-02-17 NOTE — Discharge Summary (Signed)
Physician Discharge Summary  [place] HCW:[id number] DOB: [birth year] DOA: 02/10/2014  PCP: Default, Provider, MD  Admit date: 02/10/2014 Discharge date: 02/17/2014  Time spent: 45 minutes  DISCHARGE TO LTAC  Recommendations for Outpatient Follow-up:  1. Needs 4 more days of IV primaxin after today 2. Discontinue Central line after Antibiotic complated 3. Please FU on GI pathogen panel  Discharge Diagnoses:   Severe sepsis with septic shock   ESBL (extended spectrum beta-lactamase) producing bacteria infection   E. coli UTI   Diarrhea   Dehydration with hypernatremia   HOCM (hypertrophic obstructive cardiomyopathy)   Dementia   Hypertension   History of stroke   Diabetes mellitus type 2 in nonobese   Atrial fibrillation with RVR   Protein-calorie malnutrition, severe    Discharge Condition: guarded  Diet recommendation: Dysphagia 3  Filed Weights   02/14/14 0534 02/16/14 0530 02/17/14 0629  Weight: 65.908 kg (145 lb 4.8 oz) 66.86 kg (147 lb 6.4 oz) 69.854 kg (154 lb)    History of present illness:  BRIEF PATIENT DESCRIPTION: 73 y.o. M from [place], brought in by EMS for hypotension, progressive lethargy over the past 2 weeks, and fever to 103 day of admit. Initial BP in ER in 17'O systolic, PCCM admitted due to Septic shock.  Hospital Course:  Severe sepsis with septic shock  - required pressors initially  - sepsis physiology resolved  - mild elevation lactic acid compounded by concomitant use of Metformin pre admit  - now on Primaxin day 6/10   Atrial fibrillation with RVR  - onset while in shock and DH-possible BB withdrawal  - convert to NSR with IV Amiodarone now off  - rate improved, increased PO metoprolol   Bilateral initial tuberosity pressure ulcers/unstable  -WOC RN following- present before admission  -continue wound care   ESBL producing bacteria infection/ E. coli UTI  -stopped Zosyn in favor Primaxin 2/26  - Vancomycin stopped 2/26    Hypertension with hypotension on admit  - BP improving  - titrate up BB, diovan on hold  - resumed Flomax  - foley Dced   Diabetes mellitus type 2 in non obese  - improved  - continue SSI  - holding home metformin for now   Diarrhea  -improving -c diff negative  -suspected to be due to UTI and anbx's, malabsorption  -now on immodium, started on Questran  -Gi pathogen ordered and pending at this point  Dehydration with hypernatremia  -resolved but poor oral intake noted so follow closely  -continue IVF at slow rate till diarrhea resolved   History of stroke/Dementia  -cont Plavix  -cont Aricept  -stable  Protein-calorie malnutrition, severe  -poor intake- with CVA history   HOCM (hypertrophic obstructive cardiomyopathy)  -stable   BPH  - continue flomax, foley Dced     Discharge Exam: Filed Vitals:   02/17/14 0629  BP: 179/73  Pulse: 64  Temp: 99.5 F (37.5 C)  Resp: 18    General: Alert, awake, mumbles few words Cardiovascular: S1S2/RRR Respiratory: CTAB  Discharge Instructions  Discharge Orders   Future Appointments Provider Department Dept Phone   02/26/2014 11:00 AM Dorothy Spark, MD [place] Office [phone]   Future Orders Complete By Expires   Diet - low sodium heart healthy  As directed    Diet Carb Modified  As directed    Increase activity slowly  As directed        Medication List    STOP taking these medications  ampicillin 500 MG capsule  Commonly known as:  PRINCIPEN     metFORMIN 500 MG tablet  Commonly known as:  GLUCOPHAGE     saxagliptin HCl 2.5 MG Tabs tablet  Commonly known as:  ONGLYZA     valsartan 160 MG tablet  Commonly known as:  DIOVAN      TAKE these medications       acetaminophen 325 MG tablet  Commonly known as:  TYLENOL  Take 650 mg by mouth 3 (three) times daily.     albuterol 108 (90 BASE) MCG/ACT inhaler  Commonly known as:  PROVENTIL HFA;VENTOLIN HFA  Inhale 2  puffs into the lungs every 6 (six) hours as needed for wheezing.     aspirin 81 MG EC tablet  Take 1 tablet (81 mg total) by mouth daily.     atorvastatin 20 MG tablet  Commonly known as:  LIPITOR  Take 20 mg by mouth daily at 6 PM.     BIOFREEZE 4 % Gel  Generic drug:  Menthol (Topical Analgesic)  Apply 1 application topically 3 (three) times daily. For left knee     cholestyramine 4 G packet  Commonly known as:  QUESTRAN  Take 1 packet (4 g total) by mouth 2 (two) times daily.     clopidogrel 75 MG tablet  Commonly known as:  PLAVIX  Take 75 mg by mouth daily.     collagenase ointment  Commonly known as:  SANTYL  Apply 1 application topically daily.     donepezil 10 MG tablet  Commonly known as:  ARICEPT  Take 1 tablet (10 mg total) by mouth at bedtime.     loperamide 2 MG capsule  Commonly known as:  IMODIUM  Take 1 capsule (2 mg total) by mouth 3 (three) times daily.     metoprolol tartrate 25 MG tablet  Commonly known as:  LOPRESSOR  Take 25 mg by mouth 2 (two) times daily.     saccharomyces boulardii 250 MG capsule  Commonly known as:  FLORASTOR  Take 250 mg by mouth 2 (two) times daily. Started 02/04/14, for 14 days ending 02/22/14     tamsulosin 0.4 MG Caps capsule  Commonly known as:  FLOMAX  Take 0.4 mg by mouth daily after supper.       No Known Allergies    The results of significant diagnostics from this hospitalization (including imaging, microbiology, ancillary and laboratory) are listed below for reference.    Significant Diagnostic Studies: Dg Chest Port 1 View  02/11/2014   CLINICAL DATA:  Central line placement.  EXAM: PORTABLE CHEST - 1 VIEW  COMPARISON:  Chest radiograph performed 02/10/2014  FINDINGS: A left IJ line is seen extending into a small branch vessel off the SVC; this should be retracted 2 cm and repositioned.  The lungs are relatively well expanded and appear clear. No focal consolidation, pleural effusion or pneumothorax is seen.   The cardiomediastinal silhouette is borderline normal in size. No acute osseous abnormalities are identified.  IMPRESSION: 1. Left IJ line seen extending into a small branch vessel off the SVC; this should be retracted 2 cm and repositioned. 2. No acute cardiopulmonary process seen.  These results were called by telephone at the time of interpretation on 02/11/2014 at 4:20 AM to Nursing on [facility] ICU, who verbally acknowledged these results.   Electronically Signed   By: Garald Balding M.D.   On: 02/11/2014 04:23   Dg Chest Port 1 View  02/10/2014  CLINICAL DATA:  Altered mental status  EXAM: PORTABLE CHEST - 1 VIEW  COMPARISON:  04/03/2013  FINDINGS: Cardiomediastinal silhouette is stable. No acute infiltrate or pleural effusion. No pulmonary edema. Mild degenerative changes thoracic spine.  IMPRESSION: No active disease.   Electronically Signed   By: Lahoma Crocker M.D.   On: 02/10/2014 13:49   Dg Chest Port 1v Same Day  02/10/2014   CLINICAL DATA:  Central line placement. Tachycardia. Hypotension. Fever.  EXAM: PORTABLE CHEST - 1 VIEW SAME DAY  COMPARISON:  Today at 1:36 p.m.  FINDINGS: Right internal jugular central venous catheter tip: Lower SVC. No pneumothorax  Heart size within normal limits for projection. Atherosclerotic aortic arch. The lungs appear clear. Loss of acromiohumeral space on the left suggesting chronic left rotator cuff tear.  IMPRESSION: 1. Right IJ line tip: Lower SVC. No pneumothorax or complicating feature. 2. 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   Electronically Signed   By: Sherryl Barters M.D.   On: 02/10/2014 17:21    Microbiology: Recent Results (from the past 240 hour(s))  CULTURE, BLOOD (ROUTINE X 2)     Status: None   Collection Time    02/10/14  2:40 PM      Result Value Ref Range Status   Specimen Description BLOOD ARM LEFT   Final   Special Requests BOTTLES DRAWN AEROBIC AND ANAEROBIC 6CC   Final   Culture  Setup Time     Final   Value: 02/10/2014  22:10     Performed at [facility]   Culture     Final   Value: NO GROWTH 5 DAYS     Performed at [facility]   Report Status 02/16/2014 FINAL   Final  URINE CULTURE     Status: None   Collection Time    02/10/14  3:37 PM      Result Value Ref Range Status   Specimen Description URINE, CATHETERIZED   Final   Special Requests NONE   Final   Culture  Setup Time     Final   Value: 02/10/2014 16:32     Performed at [facility]     Final   Value: 65,000 COLONIES/ML     Performed at [facility]   Culture     Final   Value: ESCHERICHIA COLI     Note: Confirmed Extended Spectrum Beta-Lactamase Producer (ESBL) CRITICAL RESULT CALLED TO, READ BACK BY AND VERIFIED WITH: JUDITH KAOME AT 6:01 A.M. ON 02/12/14 WARRB     Performed at [facility]   Report Status 02/12/2014 FINAL   Final   Organism ID, Bacteria ESCHERICHIA COLI   Final  CULTURE, BLOOD (ROUTINE X 2)     Status: None   Collection Time    02/10/14  5:21 PM      Result Value Ref Range Status   Specimen Description BLOOD HAND LEFT   Final   Special Requests BOTTLES DRAWN AEROBIC AND ANAEROBIC B 4CC R 3CC   Final   Culture  Setup Time     Final   Value: 02/10/2014 22:10     Performed at [facility]   Culture     Final   Value: NO GROWTH 5 DAYS     Performed at [facility]   Report Status 02/16/2014 FINAL   Final  MRSA PCR SCREENING     Status: None   Collection Time  02/10/14  5:50 PM      Result Value Ref Range Status   MRSA by PCR NEGATIVE  NEGATIVE Final   Comment:            The GeneXpert MRSA Assay (FDA     approved for NASAL specimens     only), is one component of a     comprehensive MRSA colonization     surveillance program. It is not     intended to diagnose MRSA     infection nor to guide or     monitor treatment for     MRSA infections.  CLOSTRIDIUM DIFFICILE BY PCR     Status: None   Collection Time    02/11/14 12:15 PM       Result Value Ref Range Status   C difficile by pcr NEGATIVE  NEGATIVE Final     Labs: Basic Metabolic Panel:  Recent Labs Lab 02/10/14 1315  02/11/14 0500  02/12/14 0520 02/12/14 1055 02/13/14 0545 02/16/14 0505 02/17/14 0300  NA 155*  --  154*  < > 145 144 143 144 148*  K 5.0  --  3.6*  < > 3.0* 3.4* 3.5* 3.0* 3.2*  CL 116*  --  124*  < > 111 111 111 112 116*  CO2 17*  --  18*  < > 22 20 22 21 20   GLUCOSE 279*  --  138*  < > 203* 227* 194* 151* 174*  BUN 93*  --  54*  < > 30* 27* 19 14 13   CREATININE 2.77*  < > 1.42*  < > 1.02 0.97 0.83 0.88 0.76  CALCIUM 8.6  --  7.1*  < > 7.3* 7.3* 7.4* 6.8* 6.9*  MG  --   --  1.7  --   --   --   --   --   --   PHOS  --   --  2.5  --   --   --   --   --   --   < > = values in this interval not displayed. Liver Function Tests:  Recent Labs Lab 02/10/14 1315 02/17/14 0300  AST 555* 44*  ALT 285* 38  ALKPHOS 58 52  BILITOT 1.6* 0.5  PROT 8.0 5.9*  ALBUMIN 2.4* 1.6*   No results found for this basename: LIPASE, AMYLASE,  in the last 168 hours No results found for this basename: AMMONIA,  in the last 168 hours CBC:  Recent Labs Lab 02/10/14 1315 02/10/14 1721 02/13/14 0545 02/16/14 0505  WBC 15.1* 13.1* 6.0 8.3  HGB 14.4 14.0 11.3* 10.6*  HCT 43.1 42.1 34.2* 31.4*  MCV 98.2 98.4 94.5 93.5  PLT 257 216 158 125*   Cardiac Enzymes:  Recent Labs Lab 02/10/14 1316  TROPONINI <0.30   BNP: BNP (last 3 results) No results found for this basename: PROBNP,  in the last 8760 hours CBG:  Recent Labs Lab 02/16/14 0735 02/16/14 1222 02/16/14 1644 02/16/14 2044 02/17/14 0758  GLUCAP 160* 161* 198* 192* 175*       Signed:  Akim Watkinson  [facility] 02/17/2014, 12:19 PM

## 2014-02-23 NOTE — Progress Notes (Signed)
Clinical social worker assisted with patient discharge to skilled nursing facility, [facility].  CSW addressed all family questions and concerns. CSW copied chart and added all important documents. CSW also set up patient transportation with Diplomatic Services operational officer. Clinical Social Worker will sign off for now as social work intervention is no longer needed.   Rhea Pink, MSW, [place]

## 2014-02-26 ENCOUNTER — Ambulatory Visit: Payer: Medicare Other | Admitting: Cardiology

## 2014-03-23 ENCOUNTER — Encounter: Payer: Self-pay | Admitting: Internal Medicine

## 2014-03-23 ENCOUNTER — Non-Acute Institutional Stay (SKILLED_NURSING_FACILITY): Payer: Medicare Other | Admitting: Internal Medicine

## 2014-03-23 DIAGNOSIS — Z1612 Extended spectrum beta lactamase (ESBL) resistance: Principal | ICD-10-CM

## 2014-03-23 DIAGNOSIS — E46 Unspecified protein-calorie malnutrition: Secondary | ICD-10-CM

## 2014-03-23 DIAGNOSIS — E785 Hyperlipidemia, unspecified: Secondary | ICD-10-CM

## 2014-03-23 DIAGNOSIS — F0390 Unspecified dementia without behavioral disturbance: Secondary | ICD-10-CM

## 2014-03-23 DIAGNOSIS — A499 Bacterial infection, unspecified: Secondary | ICD-10-CM

## 2014-03-23 DIAGNOSIS — L89109 Pressure ulcer of unspecified part of back, unspecified stage: Secondary | ICD-10-CM

## 2014-03-23 DIAGNOSIS — L89159 Pressure ulcer of sacral region, unspecified stage: Secondary | ICD-10-CM | POA: Insufficient documentation

## 2014-03-23 DIAGNOSIS — B9689 Other specified bacterial agents as the cause of diseases classified elsewhere: Secondary | ICD-10-CM

## 2014-03-23 DIAGNOSIS — I4891 Unspecified atrial fibrillation: Secondary | ICD-10-CM

## 2014-03-23 DIAGNOSIS — L8990 Pressure ulcer of unspecified site, unspecified stage: Secondary | ICD-10-CM

## 2014-03-23 DIAGNOSIS — Z1619 Resistance to other specified beta lactam antibiotics: Secondary | ICD-10-CM

## 2014-03-23 DIAGNOSIS — E119 Type 2 diabetes mellitus without complications: Secondary | ICD-10-CM

## 2014-03-23 NOTE — Assessment & Plan Note (Signed)
Continue statin. 

## 2014-03-23 NOTE — Assessment & Plan Note (Signed)
Pt had debridement 3/17; Wound care

## 2014-03-23 NOTE — Assessment & Plan Note (Signed)
PRN insulin

## 2014-03-23 NOTE — Assessment & Plan Note (Signed)
In NSR-on lopressor

## 2014-03-23 NOTE — Assessment & Plan Note (Signed)
uroSepsis with and on augmenti until 4/15

## 2014-03-23 NOTE — Assessment & Plan Note (Signed)
Pureed diet, thin liquids AND MEDPASS TID

## 2014-03-23 NOTE — Assessment & Plan Note (Signed)
A major problem; on aricept

## 2014-03-23 NOTE — Progress Notes (Signed)
MRN: [id number] Name: Jerry Stanley  Sex: male Age: 73 y.o. DOB: April 09, 1941  [place] #: [facility] Facility/Room: 222A Level Of Care: SNF Provider: Inocencio Homes D Emergency Contacts: Extended Emergency Contact Information Primary Emergency Contact: Bullard-Bowen,Delaine Address: [place], [place] [address] [place] Phone: [phone] Mobile Phone: [phone] Relation: Sister  Code Status: DNR  Allergies: Review of patient's allergies indicates no known allergies.  Chief Complaint  Patient presents with  . SNF admission    HPI: Patient is 73 y.o. male who was hosp for ESBL urosepsis, who is admitted for weakness, supportive care.  Past Medical History  Diagnosis Date  . Stroke 02/2013  . Diabetes mellitus without complication   . Hypertension   . Dementia   . Asthma   . 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 History of prostate cancer     in Remission   . Chronic back pain   . Sacral decubitus ulcer     History reviewed. No pertinent past surgical history.    Medication List       This list is accurate as of: 03/23/14 12:01 PM.  Always use your most recent med list.               amoxicillin-clavulanate 875-125 MG per tablet  Commonly known as:  AUGMENTIN  Take 1 tablet by mouth 2 (two) times daily.     atorvastatin 20 MG tablet  Commonly known as:  LIPITOR  Take 20 mg by mouth daily at 6 PM.     calcium carbonate 1250 MG tablet  Commonly known as:  OS-CAL - dosed in mg of elemental calcium  Take 1 tablet by mouth 2 (two) times daily with a meal.     clopidogrel 75 MG tablet  Commonly known as:  PLAVIX  Take 75 mg by mouth daily.     collagenase ointment  Commonly known as:  SANTYL  Apply 1 application topically daily.     Diclofenac Sodium 3 % Gel  Place onto the skin 3 (three) times daily.     docusate sodium 100 MG capsule  Commonly known as:  COLACE  Take 100 mg by mouth daily.      donepezil 10 MG tablet  Commonly known as:  ARICEPT  Take 1 tablet (10 mg total) by mouth at bedtime.     enoxaparin 40 MG/0.4ML injection  Commonly known as:  LOVENOX  Inject 40 mg into the skin daily.     insulin aspart 100 UNIT/ML injection  Commonly known as:  novoLOG  Inject 5 Units into the skin 4 (four) times daily -  before meals and at bedtime.     lisinopril 10 MG tablet  Commonly known as:  PRINIVIL,ZESTRIL  Take 10 mg by mouth daily.     metoprolol tartrate 25 MG tablet  Commonly known as:  LOPRESSOR  Take 25 mg by mouth 2 (two) times daily.     tamsulosin 0.4 MG Caps capsule  Commonly known as:  FLOMAX  Take 0.4 mg by mouth daily after supper.        Meds ordered this encounter  Medications  . enoxaparin (LOVENOX) 40 MG/0.4ML injection    Sig: Inject 40 mg into the skin daily.  . insulin aspart (NOVOLOG) 100 UNIT/ML injection    Sig: Inject 5 Units into the skin 4 (four) times daily -  before meals and at bedtime.  . calcium carbonate (  OS-CAL - DOSED IN MG OF ELEMENTAL CALCIUM) 1250 MG tablet    Sig: Take 1 tablet by mouth 2 (two) times daily with a meal.  . amoxicillin-clavulanate (AUGMENTIN) 875-125 MG per tablet    Sig: Take 1 tablet by mouth 2 (two) times daily.  . Diclofenac Sodium 3 % GEL    Sig: Place onto the skin 3 (three) times daily.  Marland Kitchen docusate sodium (COLACE) 100 MG capsule    Sig: Take 100 mg by mouth daily.  Marland Kitchen lisinopril (PRINIVIL,ZESTRIL) 10 MG tablet    Sig: Take 10 mg by mouth daily.    Immunization History  Administered Date(s) Administered  . Pneumococcal Polysaccharide-23 04/05/2013    History  Substance Use Topics  . Smoking status: Never Smoker   . Smokeless tobacco: Not on file  . Alcohol Use: No    Family history is noncontributory    Review of Systems  DATA OBTAINED: from patient, nurse; working with [facility] GENERAL:  no fevers, fatigue, appetite changes SKIN: No itching, rash EYES: No eye pain, redness,  discharge EARS: No earache, tinnitus, change in hearing NOSE: No congestion, drainage or bleeding  MOUTH/THROAT: No mouth or tooth pain, No sore throat RESPIRATORY: No cough, wheezing, SOB CARDIAC: No chest pain, palpitations, lower extremity edema  GI: No abdominal pain, No N/V/D or constipation, No heartburn or reflux  GU: No dysuria, frequency or urgency, or incontinence  MUSCULOSKELETAL: No unrelieved bone/joint pain NEUROLOGIC: No headache, dizziness or focal weakness PSYCHIATRIC: No overt anxiety or sadness. No behavior issue.   Filed Vitals:   03/23/14 1045  BP: 121/72  Pulse: 80  Temp: 98.4 F (36.9 C)  Resp: 20    Physical Exam  GENERAL APPEARANCE: Alert, minconversant. Appropriately groomed. No acute distress.  SKIN: No diaphoresis rash; sacral wound not reviewed HEAD: Normocephalic, atraumatic  EYES: Conjunctiva/lids clear. Pupils round, reactive. EOMs intact.  EARS: External exam WNL, canals clear. Hearing grossly normal.  NOSE: No deformity or discharge.  MOUTH/THROAT: Lips w/o lesions  RESPIRATORY: Breathing is even, unlabored. Lung sounds are clear   CARDIOVASCULAR: Heart RRR no murmurs, rubs or gallops. No peripheral edema.  GASTROINTESTINAL: Abdomen is soft, non-tender, not distended w/ normal bowel sounds GENITOURINARY: Bladder non tender, not distended ; foley to Sd MUSCULOSKELETAL: feet in booties NEUROLOGIC: Oriented X3. Cranial nerves 2-12 grossly intact. Moves all extremities no tremor. PSYCHIATRIC: dementia, pleasant no behavioral issues  Patient Active Problem List   Diagnosis Date Noted  . Unspecified protein-calorie malnutrition 03/23/2014  . Sacral decubitus ulcer   . Severe sepsis with septic shock 02/12/2014  . ESBL (extended spectrum beta-lactamase) producing bacteria infection 02/12/2014  . E. coli UTI 02/12/2014  . Diarrhea 02/12/2014  . Dehydration with hypernatremia 02/12/2014  . HOCM (hypertrophic obstructive cardiomyopathy)  02/12/2014  . Dementia 02/12/2014  . Atrial fibrillation with RVR 02/11/2014  . Protein-calorie malnutrition, severe 02/11/2014  . Other and unspecified hyperlipidemia 09/23/2013  . Generalized OA 08/05/2013  . Memory loss 07/17/2013  . Urinary incontinence 04/03/2013  . UTI (lower urinary tract infection) 04/03/2013  . Hypertension 04/03/2013  . History of stroke 04/03/2013  . Hyperlipemia 04/03/2013  . Diabetes mellitus type 2 in nonobese 04/03/2013    CBC    Component Value Date/Time   WBC 8.3 02/16/2014 0505   RBC 3.36* 02/16/2014 0505   HGB 10.6* 02/16/2014 0505   HCT 31.4* 02/16/2014 0505   PLT 125* 02/16/2014 0505   MCV 93.5 02/16/2014 0505   LYMPHSABS 2.3 04/03/2013 1243   MONOABS  0.7 04/03/2013 1243   EOSABS 0.0 04/03/2013 1243   BASOSABS 0.0 04/03/2013 1243    CMP     Component Value Date/Time   NA 148* 02/17/2014 0300   K 3.2* 02/17/2014 0300   CL 116* 02/17/2014 0300   CO2 20 02/17/2014 0300   GLUCOSE 174* 02/17/2014 0300   BUN 13 02/17/2014 0300   CREATININE 0.76 02/17/2014 0300   CALCIUM 6.9* 02/17/2014 0300   PROT 5.9* 02/17/2014 0300   ALBUMIN 1.6* 02/17/2014 0300   AST 44* 02/17/2014 0300   ALT 38 02/17/2014 0300   ALKPHOS 52 02/17/2014 0300   BILITOT 0.5 02/17/2014 0300   GFRNONAA 88* 02/17/2014 0300   GFRAA >90 02/17/2014 0300    Assessment and Plan  ESBL (extended spectrum beta-lactamase) producing bacteria infection uroSepsis with and on augmenti until 4/15  Dementia A major problem; on aricept  Atrial fibrillation with RVR In NSR-on lopressor  Diabetes mellitus type 2 in nonobese PRN insulin  Sacral decubitus ulcer Pt had debridement 3/17; Wound care  Unspecified protein-calorie malnutrition Pureed diet, thin liquids AND MEDPASS TID    Hennie Duos, MD

## 2014-03-30 ENCOUNTER — Other Ambulatory Visit: Payer: Self-pay | Admitting: *Deleted

## 2014-03-30 MED ORDER — TRAMADOL HCL 50 MG PO TABS: ORAL_TABLET | ORAL | Status: DC

## 2014-03-30 NOTE — Telephone Encounter (Signed)
[facility] 

## 2014-04-07 ENCOUNTER — Non-Acute Institutional Stay (SKILLED_NURSING_FACILITY): Payer: Medicare Other | Admitting: Nurse Practitioner

## 2014-04-07 DIAGNOSIS — L8990 Pressure ulcer of unspecified site, unspecified stage: Secondary | ICD-10-CM

## 2014-04-07 DIAGNOSIS — I4891 Unspecified atrial fibrillation: Secondary | ICD-10-CM

## 2014-04-07 DIAGNOSIS — L89159 Pressure ulcer of sacral region, unspecified stage: Secondary | ICD-10-CM

## 2014-04-07 DIAGNOSIS — E119 Type 2 diabetes mellitus without complications: Secondary | ICD-10-CM

## 2014-04-07 DIAGNOSIS — I10 Essential (primary) hypertension: Secondary | ICD-10-CM

## 2014-04-07 DIAGNOSIS — L89109 Pressure ulcer of unspecified part of back, unspecified stage: Secondary | ICD-10-CM

## 2014-04-07 NOTE — Progress Notes (Signed)
Patient ID: Jerry Stanley, male   DOB: [birth year], 73 y.o.   MRN: [id number]    Nursing Home Location:  [facility]   Place of Service: SNF (31)  PCP: Default, Provider, MD  No Known Allergies  Chief Complaint  Patient presents with  . Acute Visit    HPI:  73 year old being seen today for acute visit to follow up blood pressure, diabetes, and need for Lovenox Pt was hosptialized due to sepsis and upon discharge was left on Lovenox what appears to be for prophylactic treatment. Pt was in a fib with RVR but has remained in SR since before he was discharged and remains in Sinus. Currently on plavix for stroke preventative.  Prior to discharge pt was on Diovan 240 mg for blood pressure, now on lisinopril 10 mg with frequent elevation in blood pressure  Review of Systems:  Review of Systems  Constitutional: Negative for fever, chills and weight loss.  HENT: Negative for congestion and sore throat.   Respiratory: Negative for cough and shortness of breath.   Cardiovascular: Negative for chest pain, palpitations and leg swelling.  Gastrointestinal: Negative for heartburn, abdominal pain, diarrhea and constipation.  Genitourinary: Negative for dysuria.  Musculoskeletal: Positive for joint pain (conts to complain of knee and leg pain). Negative for myalgias.  Skin: Negative.   Neurological: Negative for dizziness, weakness and headaches.  Psychiatric/Behavioral: Positive for memory loss. Negative for depression. The patient does not have insomnia.      Past Medical History  Diagnosis Date  . Stroke 02/2013  . Diabetes mellitus without complication   . Hypertension   . Dementia   . Asthma   . 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 History of prostate cancer     in Remission   . Chronic back pain   . Sacral decubitus ulcer    No past surgical history on file. Social History:   reports that he has never smoked. He does not have any smokeless tobacco  history on file. He reports that he does not drink alcohol or use illicit drugs.  No family history on file.  Medications: Patient's Medications  New Prescriptions   No medications on file  Previous Medications   ATORVASTATIN (LIPITOR) 20 MG TABLET    Take 20 mg by mouth daily at 6 PM.   CALCIUM CARBONATE (OS-CAL - DOSED IN MG OF ELEMENTAL CALCIUM) 1250 MG TABLET    Take 1 tablet by mouth 2 (two) times daily with a meal.   CLOPIDOGREL (PLAVIX) 75 MG TABLET    Take 75 mg by mouth daily.   COLLAGENASE (SANTYL) OINTMENT    Apply 1 application topically daily.   DICLOFENAC SODIUM 3 % GEL    Place onto the skin 3 (three) times daily.   DOCUSATE SODIUM (COLACE) 100 MG CAPSULE    Take 100 mg by mouth daily.   DONEPEZIL (ARICEPT) 10 MG TABLET    Take 1 tablet (10 mg total) by mouth at bedtime.   ENOXAPARIN (LOVENOX) 40 MG/0.4ML INJECTION    Inject 40 mg into the skin daily.   INSULIN ASPART (NOVOLOG) 100 UNIT/ML INJECTION    Inject 5 Units into the skin 4 (four) times daily -  before meals and at bedtime.   LISINOPRIL (PRINIVIL,ZESTRIL) 10 MG TABLET    Take 10 mg by mouth daily.   METOPROLOL TARTRATE (LOPRESSOR) 25 MG TABLET    Take 25 mg by mouth 2 (two) times daily.   TAMSULOSIN (FLOMAX) 0.4  MG CAPS    Take 0.4 mg by mouth daily after supper.    TRAMADOL (ULTRAM) 50 MG TABLET    Take one tablet by mouth every 12 hours; Take one tablet by mouth every 6 hours as needed for pain  Modified Medications   No medications on file  Discontinued Medications   No medications on file     Physical Exam:  Filed Vitals:   04/07/14 1648  BP: 160/75  Pulse: 75  Temp: 98.2 F (36.8 C)  Resp: 20    Physical Exam  Constitutional: He is well-developed, well-nourished, and in no distress.  HENT:  Head: Normocephalic and atraumatic.  Eyes: Conjunctivae and EOM are normal. Pupils are equal, round, and reactive to light.  Neck: Normal range of motion. Neck supple.  Cardiovascular: Normal rate, regular  rhythm and normal heart sounds.   Pulmonary/Chest: Effort normal and breath sounds normal. No respiratory distress.  Abdominal: Soft. Bowel sounds are normal. He exhibits no distension. There is no tenderness.  Musculoskeletal:  In bed with left leg contracture   Neurological: He is alert.  Skin: Skin is warm and dry. He is not diaphoretic.  Psychiatric: Affect normal.     Labs reviewed: Basic Metabolic Panel:  Recent Labs  02/10/14 1315  02/11/14 0500  02/13/14 0545 02/16/14 0505 02/17/14 0300  NA 155*  --  154*  < > 143 144 148*  K 5.0  --  3.6*  < > 3.5* 3.0* 3.2*  CL 116*  --  124*  < > 111 112 116*  CO2 17*  --  18*  < > 22 21 20   GLUCOSE 279*  --  138*  < > 194* 151* 174*  BUN 93*  --  54*  < > 19 14 13   CREATININE 2.77*  < > 1.42*  < > 0.83 0.88 0.76  CALCIUM 8.6  --  7.1*  < > 7.4* 6.8* 6.9*  MG  --   --  1.7  --   --   --   --   PHOS  --   --  2.5  --   --   --   --   < > = values in this interval not displayed. Liver Function Tests:  Recent Labs  02/10/14 1315 02/17/14 0300  AST 555* 44*  ALT 285* 38  ALKPHOS 58 52  BILITOT 1.6* 0.5  PROT 8.0 5.9*  ALBUMIN 2.4* 1.6*   No results found for this basename: LIPASE, AMYLASE,  in the last 8760 hours No results found for this basename: AMMONIA,  in the last 8760 hours CBC:  Recent Labs  02/10/14 1721 02/13/14 0545 02/16/14 0505  WBC 13.1* 6.0 8.3  HGB 14.0 11.3* 10.6*  HCT 42.1 34.2* 31.4*  MCV 98.4 94.5 93.5  PLT 216 158 125*   Cardiac Enzymes:  Recent Labs  02/10/14 1316  TROPONINI <0.30   BNP: No components found with this basename: POCBNP,  CBG:  Recent Labs  02/17/14 0758 02/17/14 1229 02/17/14 1654  GLUCAP 175* 159* 147*   TSH: No results found for this basename: TSH,  in the last 8760 hours A1C: Lab Results  Component Value Date   HGBA1C 7.7* 04/04/2013    Assessment/Plan 1. 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 Atrial fibrillation with RVR -currently in SR and rate controlled on lopressor, will dc Lovenox at this time  -conts plavix   3. Hypertension -having episodes of elevated blood pressure, on lisinopril 10 mg , will increase to 20 mg daily for better controlled and give PRN clonidine for sbp over 180 -will get cmp  4. Sacral decubitus ulcer -stable conts To follow with wound care

## 2014-04-15 ENCOUNTER — Non-Acute Institutional Stay (SKILLED_NURSING_FACILITY): Payer: Medicare Other | Admitting: Internal Medicine

## 2014-04-15 DIAGNOSIS — R509 Fever, unspecified: Secondary | ICD-10-CM

## 2014-04-15 DIAGNOSIS — E870 Hyperosmolality and hypernatremia: Secondary | ICD-10-CM

## 2014-04-15 NOTE — Progress Notes (Signed)
MRN: [id number] Name: Jerry Stanley  Sex: male Age: 73 y.o. DOB: 14-Jun-1941  [place] #: Helene Kelp Facility/Room: 222A Level Of Care: SNF Provider: Hennie Duos Emergency Contacts: Extended Emergency Contact Information Primary Emergency Contact: Bullard-Bowen,Delaine Address: [place], [place] [address] [place] Phone: [phone] Mobile Phone: [phone] Relation: Sister  Code Status: DNR  Allergies: Review of patient's allergies indicates no known allergies.  Chief Complaint  Patient presents with  . Acute Visit    HPI: Patient is 73 y.o. male who I am seing because he was found minimally responsive this am.  Past Medical History  Diagnosis Date  . Stroke 02/2013  . Diabetes mellitus without complication   . Hypertension   . Dementia   . Asthma   . 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 History of prostate cancer     in Remission   . Chronic back pain   . Sacral decubitus ulcer     No past surgical history on file.    Medication List    Notice   Cannot display patient medications because the patient has not yet arrived.      No orders of the defined types were placed in this encounter.    Immunization History  Administered Date(s) Administered  . Pneumococcal Polysaccharide-23 04/05/2013    History  Substance Use Topics  . Smoking status: Never Smoker   . Smokeless tobacco: Not on file  . Alcohol Use: No    Review of Systems  DATA OBTAINED: from nurse, GENERAL +fevers, dec appetite  SKIN: No itching, rash HEENT: No complaint RESPIRATORY: No cough, wheezing, SOB CARDIAC: No chest pain, palpitations, lower extremity edema  GI: No abdominal pain, No N/V/D or constipation, No heartburn or reflux  GU: No dysuria, frequency or urgency, or incontinence  MUSCULOSKELETAL: No unrelieved bone/joint pain NEUROLOGIC: No headache, dizziness or focal weakness PSYCHIATRIC: No overt anxiety or  sadness. Sleeps well.   Filed Vitals:   04/15/14 1513  BP: 104/60  Pulse: 123  Temp: 100.5 F (38.1 C)  Resp: 18    Physical Exam  GENERAL APPEARANCE: minimally responsive SKIN: No diaphoresis rash; wounds dressed HEENT: Unremarkable RESPIRATORY: Breathing is even, abdominal  Lung sounds are decreased, no wheeze, no rales   CARDIOVASCULAR: Heart no murmurs, rubs or gallops. No peripheral edema  GASTROINTESTINAL: Abdomen is soft, non-tender, not distended w/ normal bowel sounds.  GENITOURINARY: Bladder non tender, not distended  MUSCULOSKELETAL: No abnormal joints or musculature NEUROLOGIC: Cranial nerves 2-12 grossly intact. No focal deficits   Patient Active Problem List   Diagnosis Date Noted  . Unspecified protein-calorie malnutrition 03/23/2014  . Sacral decubitus ulcer   . Severe sepsis with septic shock 02/12/2014  . ESBL (extended spectrum beta-lactamase) producing bacteria infection 02/12/2014  . E. coli UTI 02/12/2014  . Diarrhea 02/12/2014  . Dehydration with hypernatremia 02/12/2014  . HOCM (hypertrophic obstructive cardiomyopathy) 02/12/2014  . Dementia 02/12/2014  . Atrial fibrillation with RVR 02/11/2014  . Protein-calorie malnutrition, severe 02/11/2014  . Other and unspecified hyperlipidemia 09/23/2013  . Generalized OA 08/05/2013  . Memory loss 07/17/2013  . Urinary incontinence 04/03/2013  . UTI (lower urinary tract infection) 04/03/2013  . Hypertension 04/03/2013  . History of stroke 04/03/2013  . Hyperlipemia 04/03/2013  . Diabetes mellitus type 2 in nonobese 04/03/2013    CBC    Component Value Date/Time   WBC 8.3 02/16/2014 0505   RBC 3.36* 02/16/2014 0505  HGB 10.6* 02/16/2014 0505   HCT 31.4* 02/16/2014 0505   PLT 125* 02/16/2014 0505   MCV 93.5 02/16/2014 0505   LYMPHSABS 2.3 04/03/2013 1243   MONOABS 0.7 04/03/2013 1243   EOSABS 0.0 04/03/2013 1243   BASOSABS 0.0 04/03/2013 1243    CMP     Component Value Date/Time   NA 148* 02/17/2014 0300    K 3.2* 02/17/2014 0300   CL 116* 02/17/2014 0300   CO2 20 02/17/2014 0300   GLUCOSE 174* 02/17/2014 0300   BUN 13 02/17/2014 0300   CREATININE 0.76 02/17/2014 0300   CALCIUM 6.9* 02/17/2014 0300   PROT 5.9* 02/17/2014 0300   ALBUMIN 1.6* 02/17/2014 0300   AST 44* 02/17/2014 0300   ALT 38 02/17/2014 0300   ALKPHOS 52 02/17/2014 0300   BILITOT 0.5 02/17/2014 0300   GFRNONAA 88* 02/17/2014 0300   GFRAA >90 02/17/2014 0300    Assessment and Plan  Dehydration with hypernatremia Pt with tachcardia ,low BP and no UOP for several hours. Per nurse recent poor po intake.O2 sat low 90's. BS 267., fever 100.5.  IVF with NS 250 cc bolus, then 100 cc hr ordered. Stat BMP, CBC and CXR ordered. Family called and came over and desires treatment here at SNF, pt is pallative care.  When BMP arrive several hours later  NA 160, BUN 56, Cr 2.4 Plans made for a liter NS, then Liter 1/2 NS then next liter based on am BMP. After the 250 cc bolus pt's respiratory rate and abd breathing imptoved.  FEVER - pt could be septic or it could all be dehydration. Pt has not had a prior cough. Obtainng stat CXR and urine. Blood cx here are problematic and not reliable in my humble opinion.  Hennie Duos, MD

## 2014-04-16 ENCOUNTER — Encounter: Payer: Self-pay | Admitting: Internal Medicine

## 2014-04-16 ENCOUNTER — Non-Acute Institutional Stay (SKILLED_NURSING_FACILITY): Payer: Medicare Other | Admitting: Internal Medicine

## 2014-04-16 ENCOUNTER — Emergency Department (HOSPITAL_COMMUNITY): Payer: Medicare Other

## 2014-04-16 ENCOUNTER — Encounter (HOSPITAL_COMMUNITY): Payer: Self-pay | Admitting: Emergency Medicine

## 2014-04-16 ENCOUNTER — Inpatient Hospital Stay (HOSPITAL_COMMUNITY)
Admission: EM | Admit: 2014-04-16 | Discharge: 2014-05-18 | DRG: 871 | Disposition: E | Payer: Medicare Other | Attending: Internal Medicine | Admitting: Internal Medicine

## 2014-04-16 DIAGNOSIS — R6521 Severe sepsis with septic shock: Secondary | ICD-10-CM

## 2014-04-16 DIAGNOSIS — A4902 Methicillin resistant Staphylococcus aureus infection, unspecified site: Secondary | ICD-10-CM | POA: Diagnosis present

## 2014-04-16 DIAGNOSIS — G9340 Encephalopathy, unspecified: Secondary | ICD-10-CM | POA: Diagnosis not present

## 2014-04-16 DIAGNOSIS — E872 Acidosis, unspecified: Secondary | ICD-10-CM | POA: Diagnosis present

## 2014-04-16 DIAGNOSIS — N390 Urinary tract infection, site not specified: Secondary | ICD-10-CM | POA: Diagnosis present

## 2014-04-16 DIAGNOSIS — M549 Dorsalgia, unspecified: Secondary | ICD-10-CM | POA: Diagnosis present

## 2014-04-16 DIAGNOSIS — R000 Tachycardia, unspecified: Secondary | ICD-10-CM | POA: Diagnosis present

## 2014-04-16 DIAGNOSIS — F0390 Unspecified dementia without behavioral disturbance: Secondary | ICD-10-CM | POA: Diagnosis present

## 2014-04-16 DIAGNOSIS — IMO0002 Reserved for concepts with insufficient information to code with codable children: Secondary | ICD-10-CM

## 2014-04-16 DIAGNOSIS — I421 Obstructive hypertrophic cardiomyopathy: Secondary | ICD-10-CM | POA: Diagnosis present

## 2014-04-16 DIAGNOSIS — N170 Acute kidney failure with tubular necrosis: Secondary | ICD-10-CM | POA: Diagnosis present

## 2014-04-16 DIAGNOSIS — Z7902 Long term (current) use of antithrombotics/antiplatelets: Secondary | ICD-10-CM

## 2014-04-16 DIAGNOSIS — F411 Generalized anxiety disorder: Secondary | ICD-10-CM | POA: Diagnosis present

## 2014-04-16 DIAGNOSIS — E870 Hyperosmolality and hypernatremia: Secondary | ICD-10-CM

## 2014-04-16 DIAGNOSIS — R0600 Dyspnea, unspecified: Secondary | ICD-10-CM

## 2014-04-16 DIAGNOSIS — R6520 Severe sepsis without septic shock: Secondary | ICD-10-CM

## 2014-04-16 DIAGNOSIS — I10 Essential (primary) hypertension: Secondary | ICD-10-CM | POA: Diagnosis present

## 2014-04-16 DIAGNOSIS — B9620 Unspecified Escherichia coli [E. coli] as the cause of diseases classified elsewhere: Secondary | ICD-10-CM

## 2014-04-16 DIAGNOSIS — R197 Diarrhea, unspecified: Secondary | ICD-10-CM

## 2014-04-16 DIAGNOSIS — I4891 Unspecified atrial fibrillation: Secondary | ICD-10-CM | POA: Diagnosis present

## 2014-04-16 DIAGNOSIS — E119 Type 2 diabetes mellitus without complications: Secondary | ICD-10-CM | POA: Diagnosis present

## 2014-04-16 DIAGNOSIS — R64 Cachexia: Secondary | ICD-10-CM | POA: Diagnosis present

## 2014-04-16 DIAGNOSIS — R52 Pain, unspecified: Secondary | ICD-10-CM

## 2014-04-16 DIAGNOSIS — A499 Bacterial infection, unspecified: Secondary | ICD-10-CM

## 2014-04-16 DIAGNOSIS — A498 Other bacterial infections of unspecified site: Secondary | ICD-10-CM

## 2014-04-16 DIAGNOSIS — E43 Unspecified severe protein-calorie malnutrition: Secondary | ICD-10-CM | POA: Diagnosis present

## 2014-04-16 DIAGNOSIS — L89159 Pressure ulcer of sacral region, unspecified stage: Secondary | ICD-10-CM | POA: Diagnosis present

## 2014-04-16 DIAGNOSIS — A419 Sepsis, unspecified organism: Principal | ICD-10-CM | POA: Diagnosis present

## 2014-04-16 DIAGNOSIS — G8929 Other chronic pain: Secondary | ICD-10-CM | POA: Diagnosis present

## 2014-04-16 DIAGNOSIS — L8990 Pressure ulcer of unspecified site, unspecified stage: Secondary | ICD-10-CM | POA: Diagnosis present

## 2014-04-16 DIAGNOSIS — L89109 Pressure ulcer of unspecified part of back, unspecified stage: Secondary | ICD-10-CM | POA: Diagnosis present

## 2014-04-16 DIAGNOSIS — R569 Unspecified convulsions: Secondary | ICD-10-CM

## 2014-04-16 DIAGNOSIS — Z794 Long term (current) use of insulin: Secondary | ICD-10-CM

## 2014-04-16 DIAGNOSIS — I248 Other forms of acute ischemic heart disease: Secondary | ICD-10-CM | POA: Diagnosis present

## 2014-04-16 DIAGNOSIS — Z8546 Personal history of malignant neoplasm of prostate: Secondary | ICD-10-CM

## 2014-04-16 DIAGNOSIS — Z515 Encounter for palliative care: Secondary | ICD-10-CM

## 2014-04-16 DIAGNOSIS — I2489 Other forms of acute ischemic heart disease: Secondary | ICD-10-CM | POA: Diagnosis present

## 2014-04-16 DIAGNOSIS — Z66 Do not resuscitate: Secondary | ICD-10-CM | POA: Diagnosis present

## 2014-04-16 DIAGNOSIS — G4710 Hypersomnia, unspecified: Secondary | ICD-10-CM | POA: Diagnosis present

## 2014-04-16 DIAGNOSIS — Z8673 Personal history of transient ischemic attack (TIA), and cerebral infarction without residual deficits: Secondary | ICD-10-CM

## 2014-04-16 DIAGNOSIS — N179 Acute kidney failure, unspecified: Secondary | ICD-10-CM | POA: Diagnosis present

## 2014-04-16 DIAGNOSIS — Z1612 Extended spectrum beta lactamase (ESBL) resistance: Secondary | ICD-10-CM

## 2014-04-16 DIAGNOSIS — E860 Dehydration: Secondary | ICD-10-CM

## 2014-04-16 HISTORY — DX: Malignant (primary) neoplasm, unspecified: C80.1

## 2014-04-16 LAB — CBC WITH DIFFERENTIAL/PLATELET
Basophils Absolute: 0 10*3/uL (ref 0.0–0.1)
Basophils Relative: 0 % (ref 0–1)
Eosinophils Absolute: 0 10*3/uL (ref 0.0–0.7)
Eosinophils Relative: 0 % (ref 0–5)
HEMATOCRIT: 42.8 % (ref 39.0–52.0)
HEMOGLOBIN: 12.9 g/dL — AB (ref 13.0–17.0)
LYMPHS PCT: 19 % (ref 12–46)
Lymphs Abs: 3.4 10*3/uL (ref 0.7–4.0)
MCH: 29.5 pg (ref 26.0–34.0)
MCHC: 30.1 g/dL (ref 30.0–36.0)
MCV: 97.7 fL (ref 78.0–100.0)
MONO ABS: 1.3 10*3/uL — AB (ref 0.1–1.0)
MONOS PCT: 7 % (ref 3–12)
Neutro Abs: 13.5 10*3/uL — ABNORMAL HIGH (ref 1.7–7.7)
Neutrophils Relative %: 74 % (ref 43–77)
Platelets: 409 10*3/uL — ABNORMAL HIGH (ref 150–400)
RBC: 4.38 MIL/uL (ref 4.22–5.81)
RDW: 13.9 % (ref 11.5–15.5)
WBC: 18.3 10*3/uL — ABNORMAL HIGH (ref 4.0–10.5)

## 2014-04-16 LAB — URINALYSIS, ROUTINE W REFLEX MICROSCOPIC
Glucose, UA: 100 mg/dL — AB
KETONES UR: NEGATIVE mg/dL
NITRITE: POSITIVE — AB
Protein, ur: 100 mg/dL — AB
Specific Gravity, Urine: >1.03 — ABNORMAL HIGH (ref 1.005–1.030)
UROBILINOGEN UA: 1 mg/dL (ref 0.0–1.0)
pH: 5 (ref 5.0–8.0)

## 2014-04-16 LAB — MRSA PCR SCREENING: MRSA by PCR: POSITIVE — AB

## 2014-04-16 LAB — COMPREHENSIVE METABOLIC PANEL
ALT: 59 U/L — ABNORMAL HIGH (ref 0–53)
AST: 115 U/L — AB (ref 0–37)
Albumin: 2.5 g/dL — ABNORMAL LOW (ref 3.5–5.2)
Alkaline Phosphatase: 73 U/L (ref 39–117)
BILIRUBIN TOTAL: 1.2 mg/dL (ref 0.3–1.2)
BUN: 78 mg/dL — AB (ref 6–23)
CHLORIDE: 115 meq/L — AB (ref 96–112)
CO2: 23 meq/L (ref 19–32)
CREATININE: 2.6 mg/dL — AB (ref 0.50–1.35)
Calcium: 8.6 mg/dL (ref 8.4–10.5)
GFR, EST AFRICAN AMERICAN: 26 mL/min — AB (ref >90)
GFR, EST NON AFRICAN AMERICAN: 23 mL/min — AB (ref >90)
Glucose, Bld: 273 mg/dL — ABNORMAL HIGH (ref 70–99)
Potassium: 3.5 mEq/L — ABNORMAL LOW (ref 3.7–5.3)
Sodium: 161 mEq/L — ABNORMAL HIGH (ref 137–147)
Total Protein: 8.7 g/dL — ABNORMAL HIGH (ref 6.0–8.3)

## 2014-04-16 LAB — URINE MICROSCOPIC-ADD ON

## 2014-04-16 LAB — GLUCOSE, CAPILLARY
Glucose-Capillary: 214 mg/dL — ABNORMAL HIGH (ref 70–99)
Glucose-Capillary: 204 mg/dL — ABNORMAL HIGH (ref 70–99)

## 2014-04-16 LAB — I-STAT CG4 LACTIC ACID, ED
LACTIC ACID, VENOUS: 4.93 mmol/L — AB (ref 0.5–2.2)
LACTIC ACID, VENOUS: 3.32 mmol/L — AB (ref 0.5–2.2)

## 2014-04-16 MED ORDER — HEPARIN SODIUM (PORCINE) 5000 UNIT/ML IJ SOLN
5000.0000 [IU] | Freq: Three times a day (TID) | INTRAMUSCULAR | Status: DC
  Administered 2014-04-16 – 2014-04-17 (×2): 5000 [IU] via SUBCUTANEOUS
  Filled 2014-04-16 (×5): qty 1

## 2014-04-16 MED ORDER — SODIUM CHLORIDE 0.9 % IV BOLUS (SEPSIS)
1000.0000 mL | Freq: Once | INTRAVENOUS | Status: AC
  Administered 2014-04-16: 1000 mL via INTRAVENOUS

## 2014-04-16 MED ORDER — SODIUM CHLORIDE 0.9 % IV SOLN
1000.0000 mL | Freq: Once | INTRAVENOUS | Status: AC
  Administered 2014-04-16: 1000 mL via INTRAVENOUS

## 2014-04-16 MED ORDER — ONDANSETRON HCL 4 MG/2ML IJ SOLN: 4.0000 mg | Freq: Four times a day (QID) | INTRAMUSCULAR | Status: DC | PRN

## 2014-04-16 MED ORDER — COLLAGENASE 250 UNIT/GM EX OINT
1.0000 "application " | TOPICAL_OINTMENT | Freq: Every day | CUTANEOUS | Status: DC
  Filled 2014-04-16: qty 30

## 2014-04-16 MED ORDER — CLOPIDOGREL BISULFATE 75 MG PO TABS
75.0000 mg | ORAL_TABLET | Freq: Every day | ORAL | Status: DC
  Filled 2014-04-16: qty 1

## 2014-04-16 MED ORDER — ONDANSETRON HCL 4 MG PO TABS: 4.0000 mg | ORAL_TABLET | Freq: Four times a day (QID) | ORAL | Status: DC | PRN

## 2014-04-16 MED ORDER — DIGOXIN 0.25 MG/ML IJ SOLN
0.2500 mg | Freq: Once | INTRAMUSCULAR | Status: AC
  Administered 2014-04-16: 0.25 mg via INTRAVENOUS
  Filled 2014-04-16: qty 1

## 2014-04-16 MED ORDER — CHLORHEXIDINE GLUCONATE CLOTH 2 % EX PADS
6.0000 | MEDICATED_PAD | Freq: Every day | CUTANEOUS | Status: DC
  Administered 2014-04-17: 6 via TOPICAL

## 2014-04-16 MED ORDER — SODIUM CHLORIDE 0.9 % IV SOLN
250.0000 mg | Freq: Four times a day (QID) | INTRAVENOUS | Status: DC
  Administered 2014-04-16 – 2014-04-17 (×3): 250 mg via INTRAVENOUS
  Filled 2014-04-16 (×7): qty 250

## 2014-04-16 MED ORDER — SODIUM CHLORIDE 0.9 % IV SOLN
INTRAVENOUS | Status: DC
  Administered 2014-04-17: 13:00:00 via INTRAVENOUS

## 2014-04-16 MED ORDER — DIGOXIN 0.25 MG/ML IJ SOLN: 0.1250 mg | Freq: Every day | INTRAMUSCULAR | Status: DC

## 2014-04-16 MED ORDER — SODIUM CHLORIDE 0.9 % IV SOLN
1000.0000 mL | INTRAVENOUS | Status: DC
  Administered 2014-04-16: 1000 mL via INTRAVENOUS

## 2014-04-16 MED ORDER — DIGOXIN 0.25 MG/ML IJ SOLN
0.2500 mg | Freq: Once | INTRAMUSCULAR | Status: AC
  Administered 2014-04-17: 0.25 mg via INTRAVENOUS
  Filled 2014-04-16: qty 1

## 2014-04-16 MED ORDER — SODIUM CHLORIDE 0.9 % IJ SOLN: 3.0000 mL | Freq: Two times a day (BID) | INTRAMUSCULAR | Status: DC

## 2014-04-16 MED ORDER — ACETAMINOPHEN 325 MG PO TABS: 650.0000 mg | ORAL_TABLET | Freq: Four times a day (QID) | ORAL | Status: DC | PRN

## 2014-04-16 MED ORDER — ACETAMINOPHEN 650 MG RE SUPP
650.0000 mg | Freq: Four times a day (QID) | RECTAL | Status: DC | PRN
  Administered 2014-04-16 – 2014-04-17 (×2): 650 mg via RECTAL
  Filled 2014-04-16 (×2): qty 1

## 2014-04-16 MED ORDER — DEXTROSE-NACL 5-0.45 % IV SOLN
INTRAVENOUS | Status: DC
  Administered 2014-04-17: 02:00:00 via INTRAVENOUS

## 2014-04-16 MED ORDER — SODIUM CHLORIDE 0.9 % IV SOLN: INTRAVENOUS | Status: DC

## 2014-04-16 MED ORDER — MUPIROCIN 2 % EX OINT
1.0000 "application " | TOPICAL_OINTMENT | Freq: Two times a day (BID) | CUTANEOUS | Status: DC
  Administered 2014-04-17 (×2): 1 via NASAL
  Filled 2014-04-16: qty 22

## 2014-04-16 MED ORDER — ACETAMINOPHEN 650 MG RE SUPP
650.0000 mg | Freq: Once | RECTAL | Status: AC
  Administered 2014-04-16: 650 mg via RECTAL
  Filled 2014-04-16: qty 1

## 2014-04-16 MED ORDER — MORPHINE SULFATE 2 MG/ML IJ SOLN: 1.0000 mg | INTRAMUSCULAR | Status: DC | PRN

## 2014-04-16 NOTE — ED Notes (Signed)
Lactic acid results given to nurse Levada Dy

## 2014-04-16 NOTE — ED Notes (Signed)
Ems:  Patient from [facility], [place] x 2days, febrile at triage, HR 162, 104.2 rectal temperature, patient is semi-response, not followign commands, hx of UTI. Code sepsis called According to EMS, patient is DNR, yesterday nurses at [facility] wanted patient to come to hospial, family refused, today they wanted patient to come to hospital.

## 2014-04-16 NOTE — ED Notes (Signed)
Report attempted 

## 2014-04-16 NOTE — ED Provider Notes (Signed)
CSN: [id number]     Arrival date & time 03/28/2014  1212 History   First MD Initiated Contact with Patient 04/09/2014 1223     Chief Complaint  Patient presents with  . Code Sepsis     (Consider location/radiation/quality/duration/timing/severity/associated sxs/prior Treatment) HPI Level 5 caveat due to unresponsive Pt with history of dementia and recurrent ESBL E Coli UTI was admitted for severe sepsis about 2 months ago, completed Primaxin then. He has been at [facility] since then. In the last 2-3 days he has had progressively worsening confusion, fever, tachycardia, decreased urine output, tachycardia. Labs done yesterday showed Na 160, Cr 2.4. MD from [facility] had discussion with daughter who is HCPOA and she indicated she wanted the patient to remain there for palliation. Today with worsening condition and with pressure from other family members, she asked that the patient be brought to the ED.  Past Medical History  Diagnosis Date  . Stroke 02/2013  . Diabetes mellitus without complication   . Hypertension   . Dementia   . Asthma   . 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 History of prostate cancer     in Remission   . Chronic back pain   . Sacral decubitus ulcer    No past surgical history on file. No family history on file. History  Substance Use Topics  . Smoking status: Never Smoker   . Smokeless tobacco: Not on file  . Alcohol Use: No    Review of Systems Unable to assess due to mental status.     Allergies  Review of patient's allergies indicates no known allergies.  Home Medications   Prior to Admission medications   Medication Sig Start Date End Date Taking? Authorizing Provider  atorvastatin (LIPITOR) 20 MG tablet Take 20 mg by mouth daily at 6 PM.    Historical Provider, MD  calcium carbonate (OS-CAL - DOSED IN MG OF ELEMENTAL CALCIUM) 1250 MG tablet Take 1 tablet by mouth 2 (two) times daily with a meal.    Historical Provider, MD   clopidogrel (PLAVIX) 75 MG tablet Take 75 mg by mouth daily.    Historical Provider, MD  collagenase (SANTYL) ointment Apply 1 application topically daily.    Historical Provider, MD  Diclofenac Sodium 3 % GEL Place onto the skin 3 (three) times daily.    Historical Provider, MD  docusate sodium (COLACE) 100 MG capsule Take 100 mg by mouth daily.    Historical Provider, MD  donepezil (ARICEPT) 10 MG tablet Take 1 tablet (10 mg total) by mouth at bedtime. 08/05/13   Pricilla Larsson, NP  enoxaparin (LOVENOX) 40 MG/0.4ML injection Inject 40 mg into the skin daily.    Historical Provider, MD  insulin aspart (NOVOLOG) 100 UNIT/ML injection Inject 5 Units into the skin 4 (four) times daily -  before meals and at bedtime.    Historical Provider, MD  lisinopril (PRINIVIL,ZESTRIL) 10 MG tablet Take 10 mg by mouth daily.    Historical Provider, MD  metoprolol tartrate (LOPRESSOR) 25 MG tablet Take 25 mg by mouth 2 (two) times daily.    Historical Provider, MD  tamsulosin (FLOMAX) 0.4 MG CAPS Take 0.4 mg by mouth daily after supper.     Historical Provider, MD  traMADol (ULTRAM) 50 MG tablet Take one tablet by mouth every 12 hours; Take one tablet by mouth every 6 hours as needed for pain 03/30/14   Tiffany L Reed, DO   BP 94/56  Pulse 87  Temp(Src) 104.3 F (40.2  C) (Oral)  Resp 31  Ht 5\' 7"  (1.702 m)  Wt 154 lb 5.2 oz (70 kg)  BMI 24.16 kg/m2  SpO2 100% Physical Exam  Nursing note and vitals reviewed. Constitutional:  Thin  HENT:  Head: Normocephalic and atraumatic.  Eyes: Pupils are equal, round, and reactive to light.  Neck: Neck supple.  Cardiovascular: Intact distal pulses.  Tachycardia present.   Pulmonary/Chest: No respiratory distress. He has no wheezes.  Abdominal: Soft. Bowel sounds are normal. He exhibits no distension.  Genitourinary:  Indwelling catheter  Musculoskeletal: He exhibits no edema.  Neurological: He is unresponsive.  Skin: Skin is warm and dry. No rash noted.   Sacral decub without signs of infection  Psychiatric: He has a normal mood and affect.    ED Course  Procedures (including critical care time)  CRITICAL CARE Performed by: Mercie Eon. Andrae Claunch Total critical care time: 7 Critical care time was exclusive of separately billable procedures and treating other patients. Critical care was necessary to treat or prevent imminent or life-threatening deterioration. Critical care was time spent personally by me on the following activities: development of treatment plan with patient and/or surrogate as well as nursing, discussions with consultants, evaluation of patient's response to treatment, examination of patient, obtaining history from patient or surrogate, ordering and performing treatments and interventions, ordering and review of laboratory studies, ordering and review of radiographic studies, pulse oximetry and re-evaluation of patient's condition.   Labs Review Labs Reviewed  CBC WITH DIFFERENTIAL - Abnormal; Notable for the following:    WBC 18.3 (*)    Hemoglobin 12.9 (*)    Platelets 409 (*)    Neutro Abs 13.5 (*)    Monocytes Absolute 1.3 (*)    All other components within normal limits  COMPREHENSIVE METABOLIC PANEL - Abnormal; Notable for the following:    Sodium 161 (*)    Potassium 3.5 (*)    Chloride 115 (*)    Glucose, Bld 273 (*)    BUN 78 (*)    Creatinine, Ser 2.60 (*)    Total Protein 8.7 (*)    Albumin 2.5 (*)    AST 115 (*)    ALT 59 (*)    GFR calc non Af Amer 23 (*)    GFR calc Af Amer 26 (*)    All other components within normal limits  URINALYSIS, ROUTINE W REFLEX MICROSCOPIC - Abnormal; Notable for the following:    Color, Urine AMBER (*)    APPearance CLOUDY (*)    Specific Gravity, Urine >1.030 (*)    Glucose, UA 100 (*)    Hgb urine dipstick LARGE (*)    Bilirubin Urine MODERATE (*)    Protein, ur 100 (*)    Nitrite POSITIVE (*)    Leukocytes, UA MODERATE (*)    All other components within normal  limits  URINE MICROSCOPIC-ADD ON - Abnormal; Notable for the following:    Bacteria, UA MANY (*)    Casts GRANULAR CAST (*)    Crystals CA OXALATE CRYSTALS (*)    All other components within normal limits  I-STAT CG4 LACTIC ACID, ED - Abnormal; Notable for the following:    Lactic Acid, Venous 4.93 (*)    All other components within normal limits  I-STAT CG4 LACTIC ACID, ED - Abnormal; Notable for the following:    Lactic Acid, Venous 3.32 (*)    All other components within normal limits  CULTURE, BLOOD (ROUTINE X 2)  CULTURE, BLOOD (ROUTINE X 2)  URINE CULTURE    Imaging Review Dg Chest Port 1 View  May 08, 2014   CLINICAL DATA:  Sepsis  EXAM: PORTABLE CHEST - 1 VIEW  COMPARISON:  02/11/2014  FINDINGS: Interval removal of left central venous line. Stable cardiac silhouette. No effusion, infiltrate, or pneumothorax.  IMPRESSION: No acute cardiopulmonary process.   Electronically Signed   By: Suzy Bouchard M.D.   On: 05/08/2014 13:29     EKG Interpretation   Date/Time:  05-08-2014 13:31:59 EDT Ventricular Rate:  99 PR Interval:  116 QRS Duration: 90 QT Interval:  389 QTC Calculation: 499 R Axis:   62 Text Interpretation:  Sinus rhythm LVH with secondary repolarization  abnormality ST depression, consider ischemia, diffuse lds Borderline  prolonged QT interval Since last tracing ST now depressed in multiple  leads Confirmed by [facility]  MD, Juanda Crumble [phone]) on 08-May-2014 2:32:59 PM      MDM   Final diagnoses:  Severe sepsis  UTI (lower urinary tract infection)  Hypernatremia  ARF (acute renal failure)    After discussion with daughter at bedside. She wants to continue DNR status but would like to otherwise be aggressive with treating what is presumed to be a recurrence of his UTI and sepsis. Code Sepsis initiated. IVF bolus ordered, antipyretics, Primaxin per pharmacy.   2:33 PM HR improved, BP dropped initially but recovered now and lactate is improving. Pt  with UTI, severe sepsis, hypernatremia and renal failure. Discussed with hospitalist, admit to step-down.     Shanen Norris B. Karle Starch, MD 2014/05/08 1435

## 2014-04-16 NOTE — Assessment & Plan Note (Signed)
Follow up visit from yesterday for Na 160, BUN 56/Cr 2.4 decreased MS and no UOP; pt reported to have had 500 cc NS and 1000cc 1/2 NS, had made some urine 125 cc,which was sent and was more responsive this am.BMP from this am not back yet.  Fever earlier was 103, down to 101.1 now. CXR from yesterday came back neg for acute pathology. Upon arrival to room pt found  with L facial and LUE moderate jerking, possible eye deviation to l, definitely not tracking to R as I am on R side of bed. Pt not responsive. Family called and desires pt go to ED. He is pallative care and yesterday they wished tx in house.

## 2014-04-16 NOTE — ED Notes (Signed)
Results of lactic acid called to primary nurse Richardson Landry

## 2014-04-16 NOTE — ED Notes (Signed)
Patient has multiple decubitus ulcers on bottom and lower extremities

## 2014-04-16 NOTE — Progress Notes (Addendum)
[facility] hospitalist progress note. Chief complaint. Hypotension. History of present illness This 73 year old male admitted with acute renal failure, hypernatremia, UTI with severe sepsis. He was initially hypotensive on admission but blood pressure was improved with IV fluid resuscitation. I was notified of the patient being hypotensive with systolic blood pressures in the 40s-50s. I ordered a 1000 cc fluid bolus in addition to the 2000 cc the patient received in the emergency room. No prior history of congestive heart failure. Following my initial bolus of 1000 cc normal saline the patient's systolic blood pressure improved to the 60s but clearly still inadequate. I ordered a second 1000 cc normal saline bolus and came to see the patient at bedside. I found him hypersomnolent and minimally arousable. He does not follow commands. The patient is listed DO NOT RESUSCITATE but given the patient's critical condition I did contact his sister Jerry Stanley by phone to confirm that she did not wish to use pressors in the treatment of Jerry Stanley. She confirmed DO NOT RESUSCITATE status and did not wish to use pressors to artificially support blood pressure. She did agree with our continuing management including antibiotics and fluid resuscitation. Vital signs. Temperature 102.3, pulse 107, respiration 36, blood pressure 69/33. O2 sats 95%. General appearance. A cachectic appearing elderly male who is lethargic but in no visible distress. Cardiac. Tachycardic with rate 110 apically but regular. Skin turgor is reduced. Patient's conjunctival sacs still appear dry and I see no evidence of jugular venous distention or edema. Lungs. Breath sounds reduced in patient mildly tachypneic. O2 sats are stable and no crackles are appreciated. Abdomen. Soft with positive bowel sounds. Impression/plan. Problem #1. Hypotension secondary to severe sepsis. As above I confirmed with patient's sister DO NOT RESUSCITATE  status and we will not use pressors for blood pressure support. Given that I will continue to hydrate with fluid boluses until either the blood pressure normalizes or patient begins to demonstrate volume overload. Per assessment the patient still appears dehydrated and appears to be tolerating the fluid resuscitation well at this point. Nursing will continue to update me as needed.  Addendum: The patient developed tachycardia in the 160 range. A 12-lead EKG was obtained and this noted atrial fibrillation with rapid ventricular response. No prior history of atrial fibrillation noted. Also noted ST depression in the V3-V5. I note prior EKG that demonstrates similar ST depression. He did order troponins given a cardiac changes and ST depression with initial troponin resulting slightly elevated 0.31. This perhaps demonstrates a degree of demand ischemia given his high rate. Patient's blood pressure is too low for beta blocker. I will give 1 dose of aspirin rectally 150 mg. To treat atrial fib he is not a candidate for beta blocker or calcium channel blocker at this time due to hypotension. I will digoxin load the patient with 0.25 mg now, 0.25 mg in 6 hours, and 0.125 mg daily. If rate control is not achieved with digoxin then amiodarone may need to be considered.

## 2014-04-16 NOTE — Progress Notes (Signed)
Arrived to unit laying in stool.Pt. Cleaned up. Noted 4x4 1"  Decubitus to lt. Buttocks; 9x4 1" to Lt. Buttock and 2x2 1' to lt. Buttock. Wounds irrigated and allevyn foam applied. Pt. DNR.  See flowsheet for adm VS. Pt. Nonverbal during assessment with eyes open. When attempting to turn patient would stiffen up. Pt. Turned to lt. Side and  pillow placed to back. Foley cath with dark amber urine; scant amt. mouthcare given.

## 2014-04-16 NOTE — ED Notes (Signed)
Urine retrieved from catheter

## 2014-04-16 NOTE — Assessment & Plan Note (Addendum)
Pt with tachcardia ,low BP and no UOP for several hours. Per nurse recent poor po intake.O2 sat low 90's. BS 267., fever 100.5.  IVF with NS 250 cc bolus, then 100 cc hr ordered. Stat BMP, CBC and CXR ordered. Family called and came over and desires treatment here at SNF, pt is pallative car, I spoke with pt's sister..  When BMP arrive several hours later  NA 160, BUN 56, Cr 2.4 Plans made for a liter NS, then Liter 1/2 NS then next liter based on am BMP. After the 250 cc bolus pt's respiratory rate and abd breathing improved and pt opened his eyes to his sisters voice.

## 2014-04-16 NOTE — Progress Notes (Signed)
MRN: [id number] Name: Jerry Stanley  Sex: male Age: 73 y.o. DOB: 1941/09/29  Price #: Helene Kelp Facility/Room: 222A Level Of Care: SNF Provider: Hennie Duos Emergency Contacts: Extended Emergency Contact Information Primary Emergency Contact: Bullard-Bowen,Delaine Address: [place], [place] [address] [place] Phone: [phone] Mobile Phone: [phone] Relation: Sister  Code Status: DNR  Allergies: Review of patient's allergies indicates no known allergies.  Chief Complaint  Patient presents with  . Acute Visit    HPI: Patient is 73 y.o. male who is being followed up from yesterday. Pt is more alert today, has had 500 cc NS and 1000cc 1/2 NS and has made some urine which was sent off. Fever 103 earlier, now down to 101.1. CXR was neg, BMP from this am  Is not back, urine just obtained.  Past Medical History  Diagnosis Date  . Stroke 02/2013  . Diabetes mellitus without complication   . Hypertension   . Dementia   . Asthma   . 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 History of prostate cancer     in Remission   . Chronic back pain   . Sacral decubitus ulcer     History reviewed. No pertinent past surgical history.    Medication List    Notice   Cannot display patient medications because the patient has not yet arrived.      No orders of the defined types were placed in this encounter.    Immunization History  Administered Date(s) Administered  . Pneumococcal Polysaccharide-23 04/05/2013    History  Substance Use Topics  . Smoking status: Never Smoker   . Smokeless tobacco: Not on file  . Alcohol Use: No    Review of Systems  DATA OBTAINED: from- see HPI nurse, GENERAL: + fevers, fatigue, SKIN: No itching, rash; wounds reported debrided yesterday and not worse HEENT: No complaint RESPIRATORY: No cough, wheezing, CARDIAC: No chest pain, palpitations, lower extremity edema   GU: 125 cc  urine obtained  NEUROLOGIC: no focal weakness; more responsive   Filed Vitals:   May 05, 2014 1159  BP: 98/58  Pulse: 127  Temp: 101.1 F (38.4 C)  Resp: 24    Physical Exam  GENERAL APPEARANCE: unresponsive  SKIN: + diaphoresis , HEENT: Unremarkable RESPIRATORY: Breathing is even, unlabored. Lung sounds are clear   CARDIOVASCULAR: Heart RRR no murmurs, rubs or gallops. No peripheral edema  GASTROINTESTINAL: Abdomen is soft, non-tender, not distended w/ normal bowel sounds.  GENITOURINARY: Bladder non tender, not distended  MUSCULOSKELETAL: No abnormal joints or musculature NEUROLOGIC: pt with L facial and LUE jerking; eyes do not cross the midline to the right  Patient Active Problem List   Diagnosis Date Noted  . Unspecified protein-calorie malnutrition 03/23/2014  . Sacral decubitus ulcer   . Severe sepsis with septic shock 02/12/2014  . ESBL (extended spectrum beta-lactamase) producing bacteria infection 02/12/2014  . E. coli UTI 02/12/2014  . Diarrhea 02/12/2014  . Dehydration with hypernatremia 02/12/2014  . HOCM (hypertrophic obstructive cardiomyopathy) 02/12/2014  . Dementia 02/12/2014  . Atrial fibrillation with RVR 02/11/2014  . Protein-calorie malnutrition, severe 02/11/2014  . Other and unspecified hyperlipidemia 09/23/2013  . Generalized OA 08/05/2013  . Memory loss 07/17/2013  . Urinary incontinence 04/03/2013  . UTI (lower urinary tract infection) 04/03/2013  . Hypertension 04/03/2013  . History of stroke 04/03/2013  . Hyperlipemia 04/03/2013  . Diabetes mellitus type 2 in nonobese 04/03/2013    CBC  Component Value Date/Time   WBC 8.3 02/16/2014 0505   RBC 3.36* 02/16/2014 0505   HGB 10.6* 02/16/2014 0505   HCT 31.4* 02/16/2014 0505   PLT 125* 02/16/2014 0505   MCV 93.5 02/16/2014 0505   LYMPHSABS 2.3 04/03/2013 1243   MONOABS 0.7 04/03/2013 1243   EOSABS 0.0 04/03/2013 1243   BASOSABS 0.0 04/03/2013 1243    CMP     Component Value Date/Time   NA 148*  02/17/2014 0300   K 3.2* 02/17/2014 0300   CL 116* 02/17/2014 0300   CO2 20 02/17/2014 0300   GLUCOSE 174* 02/17/2014 0300   BUN 13 02/17/2014 0300   CREATININE 0.76 02/17/2014 0300   CALCIUM 6.9* 02/17/2014 0300   PROT 5.9* 02/17/2014 0300   ALBUMIN 1.6* 02/17/2014 0300   AST 44* 02/17/2014 0300   ALT 38 02/17/2014 0300   ALKPHOS 52 02/17/2014 0300   BILITOT 0.5 02/17/2014 0300   GFRNONAA 88* 02/17/2014 0300   GFRAA >90 02/17/2014 0300    Assessment and Plan  Dehydration with hypernatremia Follow up visit from yesterday for Na 160, BUN 56/Cr 2.4 decreased MS and no UOP; pt reported to have had 500 cc NS and 1000cc 1/2 NS, had made some urine 125 cc,which was sent and was more responsive this am.BMP from this am not back yet.  Fever earlier was 103, down to 101.1 now. CXR from yesterday came back neg for acute pathology. Upon arrival to room pt found  with L facial and LUE moderate jerking, possible eye deviation to l, definitely not tracking to R as I am on R side of bed. Pt not responsive. Family called and desires pt go to ED. He is pallative care and yesterday they wished tx in house.  PARTIAL SEIZURE- new; multiple possible etiology; family was called and desire transport to ED;pt is pallative care  Hennie Duos, MD

## 2014-04-16 NOTE — H&P (Signed)
[facility] History and Physical  Shaunn Tackitt PXT:[id number] DOB: 12/03/1941 DOA: 2014-04-18  Referring physician: Mercie Eon. Karle Starch, MD PCP: Default, Provider, MD   Chief Complaint: Septic shock  HPI: Jerry Stanley is a 73 y.o. African American male with past medical history of advanced dementia, recurrent ESBL Escherichia coli UT and sacral decubitus ulcers. Patient is DO NOT RESUSCITATE. Patient sent from nursing home because of fever and hypotension. Patient diagnosed with UTI yesterday and the nursing home, started treatment over there, initially family wants to keep him in the nursing home because he is DNR/DNI, but today they asked the staff to send him to the hospital for further evaluation, he does have more confusion and less interactive. In the emergency department patient was found to have UA consistent with UTI, lactic acid of 4.9, sodium of 161, creatinine of 2.6. Patient admitted to the hospital for further evaluation.   Review of Systems:  Unable to obtain review of system because of patient confusion.  Past Medical History  Diagnosis Date  . Stroke 02/2013  . Diabetes mellitus without complication   . Hypertension   . Dementia   . Asthma   . 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 History of prostate cancer     in Remission   . Chronic back pain   . Sacral decubitus ulcer    No past surgical history on file. Social History:   reports that he has never smoked. He does not have any smokeless tobacco history on file. He reports that he does not drink alcohol or use illicit drugs.  No Known Allergies  No family history on file.   Prior to Admission medications   Medication Sig Start Date End Date Taking? Authorizing Provider  atorvastatin (LIPITOR) 20 MG tablet Take 20 mg by mouth daily at 6 PM.    Historical Provider, MD  calcium carbonate (OS-CAL - DOSED IN MG OF ELEMENTAL CALCIUM) 1250 MG tablet Take 1 tablet by mouth 2 (two) times  daily with a meal.    Historical Provider, MD  clopidogrel (PLAVIX) 75 MG tablet Take 75 mg by mouth daily.    Historical Provider, MD  collagenase (SANTYL) ointment Apply 1 application topically daily.    Historical Provider, MD  Diclofenac Sodium 3 % GEL Place onto the skin 3 (three) times daily.    Historical Provider, MD  docusate sodium (COLACE) 100 MG capsule Take 100 mg by mouth daily.    Historical Provider, MD  donepezil (ARICEPT) 10 MG tablet Take 1 tablet (10 mg total) by mouth at bedtime. 08/05/13   Pricilla Larsson, NP  enoxaparin (LOVENOX) 40 MG/0.4ML injection Inject 40 mg into the skin daily.    Historical Provider, MD  insulin aspart (NOVOLOG) 100 UNIT/ML injection Inject 5 Units into the skin 4 (four) times daily -  before meals and at bedtime.    Historical Provider, MD  lisinopril (PRINIVIL,ZESTRIL) 10 MG tablet Take 10 mg by mouth daily.    Historical Provider, MD  metoprolol tartrate (LOPRESSOR) 25 MG tablet Take 25 mg by mouth 2 (two) times daily.    Historical Provider, MD  tamsulosin (FLOMAX) 0.4 MG CAPS Take 0.4 mg by mouth daily after supper.     Historical Provider, MD  traMADol (ULTRAM) 50 MG tablet Take one tablet by mouth every 12 hours; Take one tablet by mouth every 6 hours as needed for pain 03/30/14   Gayland Curry, DO   Physical Exam: Danley Danker Vitals:   04/18/14 1430  BP: 103/56  Pulse: 93  Temp:   Resp: 20   Constitutional: Lethargic, older, chronically ill looking, thin African American male Head: Normocephalic and atraumatic.  Nose: Nose normal.  Mouth/Throat: Uvula is midline, oropharynx is clear and moist and mucous membranes are normal.  Eyes: Conjunctivae and EOM are normal. Pupils are equal, round, and reactive to light.  Neck: Trachea normal and normal range of motion. Neck supple.  Cardiovascular: Normal rate, regular rhythm, S1 normal, S2 normal, normal heart sounds and intact distal pulses.   Pulmonary/Chest: Effort normal and breath sounds  normal.  Abdominal: Soft. Bowel sounds are normal. There is no hepatosplenomegaly. There is no tenderness.  Musculoskeletal: Normal range of motion.  Neurological: Unable to follow commands. Skin: Skin is warm, dry and intact.  He has adult diapers on him   Labs on Admission:  Basic Metabolic Panel:  Recent Labs Lab 03/25/2014 1234  NA 161*  K 3.5*  CL 115*  CO2 23  GLUCOSE 273*  BUN 78*  CREATININE 2.60*  CALCIUM 8.6   Liver Function Tests:  Recent Labs Lab 03/22/2014 1234  AST 115*  ALT 59*  ALKPHOS 73  BILITOT 1.2  PROT 8.7*  ALBUMIN 2.5*   No results found for this basename: LIPASE, AMYLASE,  in the last 168 hours No results found for this basename: AMMONIA,  in the last 168 hours CBC:  Recent Labs Lab 03/24/2014 1234  WBC 18.3*  NEUTROABS 13.5*  HGB 12.9*  HCT 42.8  MCV 97.7  PLT 409*   Cardiac Enzymes: No results found for this basename: CKTOTAL, CKMB, CKMBINDEX, TROPONINI,  in the last 168 hours  BNP (last 3 results) No results found for this basename: PROBNP,  in the last 8760 hours CBG: No results found for this basename: GLUCAP,  in the last 168 hours  Radiological Exams on Admission: Dg Chest Port 1 View  04/13/2014   CLINICAL DATA:  Sepsis  EXAM: PORTABLE CHEST - 1 VIEW  COMPARISON:  02/11/2014  FINDINGS: Interval removal of left central venous line. Stable cardiac silhouette. No effusion, infiltrate, or pneumothorax.  IMPRESSION: No acute cardiopulmonary process.   Electronically Signed   By: Suzy Bouchard M.D.   On: 04/15/2014 13:29    EKG: Independently reviewed.   Assessment/Plan Principal Problem:   Severe sepsis Active Problems:   UTI (lower urinary tract infection)   Diabetes mellitus type 2 in nonobese   Protein-calorie malnutrition, severe   Hypernatremia   Dementia   Sacral decubitus ulcer   Acute renal failure     Severe sepsis -Secondary to UTI, patient presented with fever of 104.3, heart rate of 160, in addition to  blood pressure of 82/48 initially. -After aggressive resuscitation with IV fluids blood pressure improved to 120/55. -Continue antibiotics and IV fluid resuscitation.  UTI -Has history of ESBL Escherichia coli UTI, started on Primaxin, continued. -Patient has Foley catheter in, according to family Foley catheter is in place since he discharged from [facility]. -Will adjust antibiotics according to the culture results, blood cultures obtained well.  Acute renal failure -BUN/creatinine ratio suggests this is likely secondary to dehydration and poor oral intake. -Cannot rule out hypotension/ATN as cause of his ARF. -Hydrate with IV fluids, check BMP in the morning.  Hypernatremia -Secondary to dehydration, hydrate with IV fluids, hydrate with half normal saline with D5. -Check BMP in a.m., IV fluids might need to be switched to D5W.  Advanced dementia -Patient lives in a nursing home, has DNR/DNI orders. -  Discussed with his sister Mrs. Lajean Silvius, she is interested in hospice/palliative route.  -consult palliative to see in a.m.  Sacral decubitus ulcer -Patient has sacral decubitus ulcer, we'll ask W. LOC team to develop.  Lactic acidosis -Lactic acid is 4.9, secondary to sepsis.  Code Status: DNR/DNI. Family Communication: Called his sister, Delaine confirmed DNR/DNI. Disposition Plan: Stepdown  Time spent: 70 minutes  [facility] Pager [phone]

## 2014-04-16 NOTE — Progress Notes (Signed)
ANTIBIOTIC CONSULT NOTE - INITIAL  Pharmacy Consult for Primaxin Indication: history of ESBL in urine, code sepsis  No Known Allergies  Patient Measurements: Height: 5\' 7"  (170.2 cm) Weight: 154 lb 5.2 oz (70 kg) IBW/kg (Calculated) : 66.1  Vital Signs: Temp: 104.3 F (40.2 C) (04/30 1226) Temp src: Oral (04/30 1226) BP: 148/78 mmHg (04/30 1226) Pulse Rate: 138 (04/30 1226) Intake/Output from previous day:   Intake/Output from this shift:    Labs: No results found for this basename: WBC, HGB, PLT, LABCREA, CREATININE,  in the last 72 hours Estimated Creatinine Clearance: 76.9 ml/min (by C-G formula based on Cr of 0.76). No results found for this basename: VANCOTROUGH, VANCOPEAK, VANCORANDOM, GENTTROUGH, GENTPEAK, GENTRANDOM, TOBRATROUGH, TOBRAPEAK, TOBRARND, AMIKACINPEAK, AMIKACINTROU, AMIKACIN,  in the last 72 hours   Microbiology: No results found for this or any previous visit (from the past 720 hour(s)).  Medical History: Past Medical History  Diagnosis Date  . Stroke 02/2013  . Diabetes mellitus without complication   . Hypertension   . Dementia   . Asthma   . 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 History of prostate cancer     in Remission   . Chronic back pain   . Sacral decubitus ulcer     Assessment: 36 YOM from [facility] who has been febrile x2 days and having AMS. Noted history of sacral decubitus ulcer as well as UTI- does have history of ESBL in February of this year. Labs from [facility] yesterday with SCr of 2.4. Estimated CrCl is ~61mL/min. Blood cultures have been drawn. Attempting to get urine cultures as well (bag was dumped this morning per report).  Goal of Therapy:  eradication of infection  Plan:  1. Imipenem/cilastin 250mg  IV q6h 2. Follow up clinical progression, c/s, renal function, GOC  Jenaye Rickert D. Monika Chestang, PharmD, BCPS Clinical Pharmacist Pager: [phone] 03/22/2014 12:45 PM

## 2014-04-17 DIAGNOSIS — B9689 Other specified bacterial agents as the cause of diseases classified elsewhere: Secondary | ICD-10-CM

## 2014-04-17 DIAGNOSIS — Z515 Encounter for palliative care: Secondary | ICD-10-CM

## 2014-04-17 DIAGNOSIS — R0989 Other specified symptoms and signs involving the circulatory and respiratory systems: Secondary | ICD-10-CM

## 2014-04-17 DIAGNOSIS — R0600 Dyspnea, unspecified: Secondary | ICD-10-CM

## 2014-04-17 DIAGNOSIS — R52 Pain, unspecified: Secondary | ICD-10-CM

## 2014-04-17 DIAGNOSIS — R0609 Other forms of dyspnea: Secondary | ICD-10-CM

## 2014-04-17 DIAGNOSIS — Z1619 Resistance to other specified beta lactam antibiotics: Secondary | ICD-10-CM

## 2014-04-17 LAB — CBC
HCT: 33.2 % — ABNORMAL LOW (ref 39.0–52.0)
Hemoglobin: 9.8 g/dL — ABNORMAL LOW (ref 13.0–17.0)
MCH: 29.6 pg (ref 26.0–34.0)
MCHC: 29.5 g/dL — ABNORMAL LOW (ref 30.0–36.0)
MCV: 100.3 fL — ABNORMAL HIGH (ref 78.0–100.0)
PLATELETS: 213 10*3/uL (ref 150–400)
RBC: 3.31 MIL/uL — ABNORMAL LOW (ref 4.22–5.81)
RDW: 14.3 % (ref 11.5–15.5)
WBC: 31.3 10*3/uL — ABNORMAL HIGH (ref 4.0–10.5)

## 2014-04-17 LAB — BASIC METABOLIC PANEL
BUN: 71 mg/dL — ABNORMAL HIGH (ref 6–23)
CALCIUM: 7 mg/dL — AB (ref 8.4–10.5)
CO2: 13 mEq/L — ABNORMAL LOW (ref 19–32)
CREATININE: 2.35 mg/dL — AB (ref 0.50–1.35)
Chloride: 124 mEq/L — ABNORMAL HIGH (ref 96–112)
GFR, EST AFRICAN AMERICAN: 30 mL/min — AB (ref >90)
GFR, EST NON AFRICAN AMERICAN: 26 mL/min — AB (ref >90)
Glucose, Bld: 282 mg/dL — ABNORMAL HIGH (ref 70–99)
Potassium: 3.8 mEq/L (ref 3.7–5.3)
Sodium: 161 mEq/L — ABNORMAL HIGH (ref 137–147)

## 2014-04-17 LAB — LACTIC ACID, PLASMA: LACTIC ACID, VENOUS: 6.9 mmol/L — AB (ref 0.5–2.2)

## 2014-04-17 LAB — TROPONIN I: Troponin I: 0.31 ng/mL (ref <0.30)

## 2014-04-17 MED ORDER — MORPHINE SULFATE 2 MG/ML IJ SOLN
1.0000 mg | INTRAMUSCULAR | Status: DC | PRN
  Administered 2014-04-17: 1 mg via INTRAVENOUS
  Filled 2014-04-17: qty 1

## 2014-04-17 MED ORDER — ACETAMINOPHEN 650 MG RE SUPP
650.0000 mg | Freq: Four times a day (QID) | RECTAL | Status: DC | PRN
  Administered 2014-04-18 (×2): 650 mg via RECTAL
  Filled 2014-04-17 (×2): qty 1

## 2014-04-17 MED ORDER — SODIUM CHLORIDE 0.9 % IV BOLUS (SEPSIS)
1000.0000 mL | Freq: Once | INTRAVENOUS | Status: AC
  Administered 2014-04-17: 1000 mL via INTRAVENOUS

## 2014-04-17 MED ORDER — SODIUM CHLORIDE 0.9 % IJ SOLN: 3.0000 mL | INTRAMUSCULAR | Status: DC | PRN

## 2014-04-17 MED ORDER — LORAZEPAM 2 MG/ML IJ SOLN
1.0000 mg | INTRAMUSCULAR | Status: DC | PRN
  Filled 2014-04-17: qty 1

## 2014-04-17 MED ORDER — ONDANSETRON HCL 4 MG/2ML IJ SOLN: 4.0000 mg | Freq: Four times a day (QID) | INTRAMUSCULAR | Status: DC | PRN

## 2014-04-17 MED ORDER — GLUCERNA SHAKE PO LIQD: 237.0000 mL | Freq: Two times a day (BID) | ORAL | Status: DC

## 2014-04-17 MED ORDER — LORAZEPAM 2 MG/ML IJ SOLN: 1.0000 mg | INTRAMUSCULAR | Status: DC | PRN

## 2014-04-17 MED ORDER — ASPIRIN 300 MG RE SUPP
150.0000 mg | Freq: Once | RECTAL | Status: AC
  Administered 2014-04-17: 150 mg via RECTAL
  Filled 2014-04-17 (×2): qty 1

## 2014-04-17 MED ORDER — ACETAMINOPHEN 325 MG RE SUPP
325.0000 mg | Freq: Once | RECTAL | Status: AC
  Administered 2014-04-17: 325 mg via RECTAL
  Filled 2014-04-17: qty 1

## 2014-04-17 MED ORDER — MORPHINE SULFATE 2 MG/ML IJ SOLN
1.0000 mg | INTRAMUSCULAR | Status: DC | PRN
  Administered 2014-04-18: 1 mg via INTRAVENOUS
  Filled 2014-04-17: qty 1

## 2014-04-17 MED ORDER — SODIUM CHLORIDE 0.9 % IJ SOLN
3.0000 mL | Freq: Two times a day (BID) | INTRAMUSCULAR | Status: DC
Start: 2014-04-17 — End: 2014-04-19
  Administered 2014-04-18: 3 mL via INTRAVENOUS

## 2014-04-17 MED ORDER — SODIUM CHLORIDE 0.9 % IV SOLN: 250.0000 mL | INTRAVENOUS | Status: DC | PRN

## 2014-04-17 MED ORDER — ATROPINE SULFATE 1 % OP SOLN: 4.0000 [drp] | OPHTHALMIC | Status: DC | PRN

## 2014-04-17 MED ORDER — ONDANSETRON HCL 4 MG PO TABS: 4.0000 mg | ORAL_TABLET | Freq: Four times a day (QID) | ORAL | Status: DC | PRN

## 2014-04-17 MED ORDER — SODIUM CHLORIDE 0.9 % IV SOLN
250.0000 mg | Freq: Three times a day (TID) | INTRAVENOUS | Status: DC
  Filled 2014-04-17 (×2): qty 250

## 2014-04-17 MED ORDER — ACETAMINOPHEN 325 MG PO TABS
650.0000 mg | ORAL_TABLET | Freq: Four times a day (QID) | ORAL | Status: DC | PRN
Start: 2014-04-17 — End: 2014-04-19

## 2014-04-17 NOTE — Progress Notes (Signed)
CRITICAL VALUE ALERT  Critical value received:  Positive MRSA  Date of notification:  03/27/2014  Time of notification:  2051  Critical value read back:yes  Nurse who received alert:  Elizbeth Squires RN  MD notified (1st page):  Kathline Magic, NP  Time of first page:  2053  Responding MD:  Kathline Magic, NP  Time MD responded:  2055  MRSA protocol initiated.  Iantha Fallen RN 03/25/2014 2100

## 2014-04-17 NOTE — Progress Notes (Signed)
comrfort care in progress. 02 at 3 liters per min. Turned and positioned. See flowsheet for prn meds.

## 2014-04-17 NOTE — Progress Notes (Signed)
ANTIBIOTIC CONSULT NOTE - FOLLOW UP  Pharmacy Consult for Primaxin Indication: urosepsis, GNR bacteremia  No Known Allergies  Patient Measurements: Height: 5\' 2"  (157.5 cm) Weight: 132 lb 4.4 oz (60 kg) IBW/kg (Calculated) : 54.6  Vital Signs: Temp: 100 F (37.8 C) (05/01 0818) Temp src: Rectal (05/01 0818) BP: 79/53 mmHg (05/01 0818) Pulse Rate: 36 (05/01 0700) Intake/Output from previous day: 04/30 0701 - 05/01 0700 In: 3966.7 [I.V.:416.7; IV Piggyback:3550] Out: 501 [Urine:500; Stool:1] Intake/Output from this shift:    Labs:  Recent Labs  04/22/2014 1234 04/17/14 0145  WBC 18.3* 31.3*  HGB 12.9* 9.8*  PLT 409* 213  CREATININE 2.60* 2.35*   Estimated Creatinine Clearance: 21.6 ml/min (by C-G formula based on Cr of 2.35).    Microbiology: Recent Results (from the past 720 hour(s))  CULTURE, BLOOD (ROUTINE X 2)     Status: None   Collection Time    04/18/2014 12:39 PM      Result Value Ref Range Status   Specimen Description BLOOD RIGHT FOREARM   Final   Special Requests     Final   Value: BOTTLES DRAWN AEROBIC AND ANAEROBIC 5CCBLUE 10CCRED   Culture  Setup Time     Final   Value: 05/14/2014 16:56     Performed at [facility]   Culture     Final   Value: GRAM NEGATIVE RODS     Note: Gram Stain Report Called to,Read Back By and Verified With: CAROLYN W. @ 8:45AM 5.1.15. BY DESIS     Performed at [facility]   Report Status PENDING   Incomplete  CULTURE, BLOOD (ROUTINE X 2)     Status: None   Collection Time    05/16/2014 12:55 PM      Result Value Ref Range Status   Specimen Description BLOOD RIGHT ANTECUBITAL   Final   Special Requests BOTTLES DRAWN AEROBIC AND ANAEROBIC [facility]   Final   Culture  Setup Time     Final   Value: 04/29/2014 18:20     Performed at [facility]   Culture     Final   Value: GRAM NEGATIVE RODS     Note: Gram Stain Report Called to,Read Back By and Verified With:  CAROLYN W. @8 :45AM  5.1.15 BY DESIS   Performed at [facility]   Report Status PENDING   Incomplete  MRSA PCR SCREENING     Status: Abnormal   Collection Time    Apr 19, 2014  6:35 PM      Result Value Ref Range Status   MRSA by PCR POSITIVE (*) NEGATIVE Final   Comment:            The GeneXpert MRSA Assay (FDA     approved for NASAL specimens     only), is one component of a     comprehensive MRSA colonization     surveillance program. It is not     intended to diagnose MRSA     infection nor to guide or     monitor treatment for     MRSA infections.     RESULT CALLED TO, READ BACK BY AND VERIFIED WITH:     L.VANCE,RN AT 2053 05/04/2014 BY L.PITT    Anti-infectives   Start     Dose/Rate Route Frequency Ordered Stop   Apr 19, 2014 1300  imipenem-cilastatin (PRIMAXIN) 250 mg in sodium chloride 0.9 % 100 mL IVPB     250 mg 200 mL/hr over 30 Minutes  Intravenous 4 times per day 04/01/2014 1245        Assessment: 73 yo M admitted 4/30 with presumed urosepsis.   Blood cx now 2/2 gram neg rods.  Pt continues on abx and IV fluids for pressure support.  Will adjust Primaxin dose based on updated weight and renal function.  Goal of Therapy:  Renal Dose Adjustment of Antibiotics  Plan:  Change Primaxin to 250mg  IV q8h. Follow-up renal function, cx data, clinical progress, and GOC.  [employer], Pharm.D., BCPS Clinical Pharmacist Pager [phone] 04/17/2014 9:09 AM

## 2014-04-17 NOTE — Progress Notes (Signed)
CRITICAL VALUE ALERT  Critical value received:  Troponin 0.31  Date of notification:  04/17/14  Time of notification:  0256  Critical value read back:yes  Nurse who received alert:  Elizbeth Squires RN  MD notified (1st page):  Kathline Magic, NP  Time of first page:  0300  Responding MD:  Kathline Magic, NP  Time MD responded:  [phone]  ASA 150 mg per rectum ordered.  Will continue to monitor patient.  [place] [phone] 04/17/2014

## 2014-04-17 NOTE — Progress Notes (Signed)
INITIAL NUTRITION ASSESSMENT  DOCUMENTATION CODES Per approved criteria  -Severe malnutrition in the context of chronic illness   INTERVENTION: Glucerna Shake po BID, each supplement provides 220 kcal and 10 grams of protein RD to follow for nutrition care plan  NUTRITION DIAGNOSIS: Inadequate oral intake related to poor appetite, advanced dementia as evidenced by chart review  Goal: Pt to meet >/= 90% of their estimated nutrition needs   Monitor:  PO & supplemental intake, goals of care, weight, labs, I/O's  Reason for Assessment: Low Braden  73 y.o. male  Admitting Dx: Severe sepsis  ASSESSMENT: 73 y.o. African American male with past medical history of advanced dementia, recurrent ESBL Escherichia coli UT and sacral decubitus ulcers. Patient sent from nursing home because of fever and hypotension.    In the emergency department patient was found to have UA consistent with UTI, lactic acid of 4.9, sodium of 161, creatinine of 2.6. Patient admitted to the hospital for further evaluation.  Patient laying in bed with non-rebreather mask on; unable to interview patient; pt seen previously per Clinical Nutrition in February 2015; per H&P pt with poor PO intake PTA; per wt readings, pt continues to lose weight; has had a 14% weight loss since 02/17/2014 -- severe for time frame; pt with suspected deep tissue injury to sacrum; RD to add supplements.  Palliative Care Team consulted for goals of care.  Nutrition Focused Physical Exam:   Subcutaneous Fat:  Orbital Region: moderate-severe depletion  Upper Arm Region: WNL  Thoracic and Lumbar Region: NA  Muscle:  Temple Region: severe depletion  Clavicle Bone Region: moderate-severe depletion  Clavicle and Acromion Bone Region: moderate-severe depletion  Scapular Bone Region: NA  Dorsal Hand: WNL  Patellar Region: WNL  Anterior Thigh Region: WNL  Posterior Calf Region: WNL   Edema: none  Patient continues to meet criteria for  severe malnutrition in the context of chronic illness as evidenced by severe muscle mass depletion and 14% weight loss within 2 months.  Height: Ht Readings from Last 1 Encounters:  2014-04-28 5\' 2"  (1.575 m)    Weight: Wt Readings from Last 1 Encounters:  04/17/14 132 lb 4.4 oz (60 kg)    Ideal Body Weight: 110 lb  % Ideal Body Weight: 120%  Wt Readings from Last 10 Encounters:  04/17/14 132 lb 4.4 oz (60 kg)  02/17/14 154 lb (69.854 kg)  09/03/13 161 lb 3.2 oz (73.12 kg)  08/05/13 161 lb 3.2 oz (73.12 kg)  05/06/13 156 lb (70.761 kg)  04/03/13 154 lb 15.7 oz (70.3 kg)    Usual Body Weight: 154 lb -- March 2015  % Usual Body Weight: 85%  BMI:  Body mass index is 24.19 kg/(m^2).  Estimated Nutritional Needs: Kcal: 1500-1700 Protein: 70-80 gm Fluid: 1.5-1.7 L  Skin: suspected DTI to sacrum  Diet Order: General  EDUCATION NEEDS: -No education needs identified at this time   Intake/Output Summary (Last 24 hours) at 04/17/14 1205 Last data filed at 04/17/14 0600  Gross per 24 hour  Intake 3966.67 ml  Output    501 ml  Net 3465.67 ml    Labs:   Recent Labs Lab 2014-04-28 1234 04/17/14 0145  NA 161* 161*  K 3.5* 3.8  CL 115* 124*  CO2 23 13*  BUN 78* 71*  CREATININE 2.60* 2.35*  CALCIUM 8.6 7.0*  GLUCOSE 273* 282*    CBG (last 3)   Recent Labs  04/28/2014 1812 04-28-14 1956  GLUCAP 214* 204*  Scheduled Meds: . sodium chloride   Intravenous STAT  . Chlorhexidine Gluconate Cloth  6 each Topical Q0600  . clopidogrel  75 mg Oral Daily  . collagenase  1 application Topical Daily  . [START ON 04/18/2014] digoxin  0.125 mg Intravenous Daily  . heparin  5,000 Units Subcutaneous 3 times per day  . imipenem-cilastatin  250 mg Intravenous 3 times per day  . mupirocin ointment  1 application Nasal BID  . sodium chloride  3 mL Intravenous Q12H    Continuous Infusions: . sodium chloride    . dextrose 5 % and 0.45% NaCl 100 mL/hr at 04/17/14 0150     Past Medical History  Diagnosis Date  . Stroke 02/2013  . Diabetes mellitus without complication   . Hypertension   . Dementia   . Asthma   . 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 History of prostate cancer     in Remission   . Chronic back pain   . Sacral decubitus ulcer     No past surgical history on file.  Arthur Holms, RD, LDN Pager #: [phone] After-Hours Pager #: [phone]

## 2014-04-17 NOTE — Consult Note (Signed)
WOC discussed wound with bedside nursing they have implemented appropriate wound care and patient is pending palliative care consult.  Will continue this care and monitor status after [place] established.   POA: sDTI (suspected deep tissue injury): Sacrum  Discussed POC with patient and bedside nurse.  Re consult if needed, will not follow at this time. Thanks  Esbeydi Manago Kellogg, [place] [phone])
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
## 2014-04-17 NOTE — Progress Notes (Signed)
[facility] PROGRESS NOTE  Jerry Stanley EGB:[id number] DOB: [birth year] DOA: 04-22-2014 PCP: Default, Provider, MD  Assessment/Plan: 1. Severe sepsis -Present on admission, evidenced by hypotension with systolic blood pressures in the 60s, heart rates in the 150s, temperature of 103.1 -I. suspect source of his infection to be urinary tract. Patient having history of recurrent urinary tract infections. -Was initially started on empiric IV antibiotic therapy with IV Primaxin, and aggressive IV fluid resuscitation. -Patient showing clinical worsening this morning, becoming unresponsive. Lab work showing an upward trend in his white count to 31.3 as of lactic acid increased to 6.9 from 3.32. -Family members expressing desire to focus his care on comfort. IV fluids and IV antibiotics have been discontinued per family members request.   2. Acute encephalopathy -Patient presenting with ST functional decline, this morning becoming unresponsive. -Likely secondary to severe sepsis -Patient's care to be transitioned to comfort.  3. acute renal failure. -Presents with a creatinine of 2.6. Prior to this lab work on 02/17/2014 show a BUN and creatinine of 13 and 0.76. -Likely secondary to severe sepsis  4. Hyponatremia. -Patient presenting with a sodium of 161, I suspect secondary to profound dehydration  5. Urinary tract infection  -I. suspect UTI to be the source of infection. -UA on admission showed the presence of many bacteria, positive for nitrates and leukocyte Estrace -He was initially started on Primaxin -Urine cultures drawn from Apr 22, 2014 growing greater than 100,000 colony-forming units of Escherichia coli. He has a history of extended spectrum beta lactamase organism.  6. Goals of care -Palliative care was consulted, family members  Wishing to transition patient's care to comfort. IV antibiotics have been discontinued. Monitor closely for signs and symptoms of pain/agony.    Code Status: DNR Family Communication: I spoke with patient's sister over telephone Disposition Plan: Transition to comfort   Consultants:  Palliative   Antibiotics:  Primaxin, discontinuing of 04/17/2014  HPI/Subjective: Patient is a pleasant 73 year old time and with a past medical history of advanced dementia, recurrent ESBL Escherichia coli, admitted to the medicine service on 04-22-14 presenting as a transfer from his skilled nursing facility to the emergency department at [facility]. Patient was found to have a steep functional decline associated with fevers and hypotension. CODE STATUS reviewed with family members on admission, he is a DO NOT RESUSCITATE. He was admitted to the step down unit start her on broad-spectrum IV antimicrobial therapy. Patient remaining hypotensive overnight. Family members were notified and did not wish to pursue IV pressors or transferred to the intensive care unit. By this morning patient remained hypotensive, palliative care was consulted. Goals of care discussed with family members as  focus of his care is on comfort rather than pursuing further invasive or burdensome interventions.   Objective: Filed Vitals:   04/17/14 1605  BP: 102/54  Pulse: 101  Temp: 99.9 F (37.7 C)  Resp: 32    Intake/Output Summary (Last 24 hours) at 04/17/14 1700 Last data filed at 04/17/14 1614  Gross per 24 hour  Intake 3966.67 ml  Output   1101 ml  Net 2865.67 ml   Filed Weights   2014-04-22 1226 04-22-2014 1512 04/17/14 0400  Weight: 70 kg (154 lb 5.2 oz) 68.04 kg (150 lb) 60 kg (132 lb 4.4 oz)    Exam:   General:  Toxic, unresponsive  Cardiovascular: Irregular rate and rhythm, tachy, no edema  Respiratory: Coarse respiratory sounds  Abdomen: Soft, nontender, nondistended  Musculoskeletal: bilateral atrophy  Data Reviewed: Basic Metabolic  Panel:  Recent Labs Lab 04/03/2014 1234 04/17/14 0145  NA 161* 161*  K 3.5* 3.8  CL 115*  124*  CO2 23 13*  GLUCOSE 273* 282*  BUN 78* 71*  CREATININE 2.60* 2.35*  CALCIUM 8.6 7.0*   Liver Function Tests:  Recent Labs Lab 03/28/2014 1234  AST 115*  ALT 59*  ALKPHOS 73  BILITOT 1.2  PROT 8.7*  ALBUMIN 2.5*   No results found for this basename: LIPASE, AMYLASE,  in the last 168 hours No results found for this basename: AMMONIA,  in the last 168 hours CBC:  Recent Labs Lab 03/27/2014 1234 04/17/14 0145  WBC 18.3* 31.3*  NEUTROABS 13.5*  --   HGB 12.9* 9.8*  HCT 42.8 33.2*  MCV 97.7 100.3*  PLT 409* 213   Cardiac Enzymes:  Recent Labs Lab 04/17/14 0145  TROPONINI 0.31*   BNP (last 3 results) No results found for this basename: PROBNP,  in the last 8760 hours CBG:  Recent Labs Lab 03/21/2014 1812 03/28/2014 1956  GLUCAP 214* 204*    Recent Results (from the past 240 hour(s))  CULTURE, BLOOD (ROUTINE X 2)     Status: None   Collection Time    03/26/2014 12:39 PM      Result Value Ref Range Status   Specimen Description BLOOD RIGHT FOREARM   Final   Special Requests     Final   Value: BOTTLES DRAWN AEROBIC AND ANAEROBIC 5CCBLUE 10CCRED   Culture  Setup Time     Final   Value: 03/18/2014 16:56     Performed at [facility]   Culture     Final   Value: GRAM NEGATIVE RODS     Note: Gram Stain Report Called to,Read Back By and Verified With: CAROLYN W. @ 8:45AM 5.1.15. BY DESIS     Performed at [facility]   Report Status PENDING   Incomplete  CULTURE, BLOOD (ROUTINE X 2)     Status: None   Collection Time    03/26/2014 12:55 PM      Result Value Ref Range Status   Specimen Description BLOOD RIGHT ANTECUBITAL   Final   Special Requests BOTTLES DRAWN AEROBIC AND ANAEROBIC [facility]   Final   Culture  Setup Time     Final   Value: 03/31/2014 18:20     Performed at [facility]   Culture     Final   Value: GRAM NEGATIVE RODS     Note: Gram Stain Report Called to,Read Back By and Verified With:  CAROLYN W. @8 :45AM  5.1.15 BY DESIS      Performed at [facility]   Report Status PENDING   Incomplete  URINE CULTURE     Status: None   Collection Time    03/22/2014  1:50 PM      Result Value Ref Range Status   Specimen Description URINE, CLEAN CATCH   Final   Special Requests ADDED AT 0258 ON [id number]   Final   Culture  Setup Time     Final   Value: 03/31/2014 17:04     Performed at [facility]     Final   Value: >=100,000 COLONIES/ML     Performed at [facility]   Culture     Final   Value: ESCHERICHIA COLI     Performed at [facility]   Report Status PENDING   Incomplete  MRSA PCR SCREENING  Status: Abnormal   Collection Time    2014/05/05  6:35 PM      Result Value Ref Range Status   MRSA by PCR POSITIVE (*) NEGATIVE Final   Comment:            The GeneXpert MRSA Assay (FDA     approved for NASAL specimens     only), is one component of a     comprehensive MRSA colonization     surveillance program. It is not     intended to diagnose MRSA     infection nor to guide or     monitor treatment for     MRSA infections.     RESULT CALLED TO, READ BACK BY AND VERIFIED WITH:     L.VANCE,RN AT 2053 05-05-2014 BY L.PITT     Studies: Dg Chest Port 1 View  2014-05-05   CLINICAL DATA:  Sepsis  EXAM: PORTABLE CHEST - 1 VIEW  COMPARISON:  02/11/2014  FINDINGS: Interval removal of left central venous line. Stable cardiac silhouette. No effusion, infiltrate, or pneumothorax.  IMPRESSION: No acute cardiopulmonary process.   Electronically Signed   By: Suzy Bouchard M.D.   On: 2014-05-05 13:29    Scheduled Meds: . sodium chloride   Intravenous STAT  . Chlorhexidine Gluconate Cloth  6 each Topical Q0600  . collagenase  1 application Topical Daily  . mupirocin ointment  1 application Nasal BID  . sodium chloride  3 mL Intravenous Q12H   Continuous Infusions: . sodium chloride 10 mL/hr at 04/17/14 1300  . 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

## 2014-04-17 DEATH — deceased

## 2014-04-18 ENCOUNTER — Encounter (HOSPITAL_COMMUNITY): Payer: Self-pay

## 2014-04-18 LAB — URINE CULTURE: Colony Count: >=100000

## 2014-04-18 NOTE — Progress Notes (Signed)
[facility] PROGRESS NOTE  Jerry Stanley JEH:[id number] DOB: [birth year] DOA: 04-28-14 PCP: Default, Provider, MD   Brief narrative 73 year old time and with a past medical history of advanced dementia, recurrent ESBL Escherichia coli, admitted to the medicine service on 2014/04/28. Patient was found to have a steep functional decline associated with fevers and hypotension at a skilled nursing facility. CODE STATUS reviewed with family members on admission, he is a DO NOT RESUSCITATE. He was admitted to the step down unit start her on broad-spectrum IV antimicrobial therapy. Patient remaining hypotensive overnight. Family members were notified and did not wish to pursue IV pressors or transferred to the intensive care unit. By this morning patient remained hypotensive, palliative care was consulted. Goals of care discussed with family members as  focus of his care is on comfort rather than pursuing further invasive or burdensome interventions.   Assessment/Plan:  Severe sepsis -Present on admission, with hypotension with systolic blood pressures in the 60s, heart rates in the 150s, temperature of 103.1 -Likely source of infection is UTI patient has a fetid UTIs -Was initially started on empiric IV antibiotic therapy with IV Primaxin, and aggressive IV fluid resuscitation. -Patient clinically worsening and has increased lethargy. Lab work showing an upward trend in his white count to 31.3 as of lactic acid increased to 6.9 from 3.32. -Family members expressing desire to focus his care on comfort. IV fluids and IV antibiotics have been discontinued per family members request.  -He has been made [place] now. Patient placed on IV morphine 1 mg every 2 hours when necessary for pain and/or dyspnea and when necessary Ativan for anxiety.   . Acute encephalopathy -Patient presenting with functional decline,  progressively poorly responsive likely due to sepsis. Now made [facility]   acute renal failure. -Presented with a creatinine of 2.6. Prior to this lab work on 02/17/2014 show a BUN and creatinine of 13 and 0.76. -Likely secondary to severe sepsis   Hyponatremia.  sodium of 161 on admission secondary to profound dehydration  Urinary tract infection  Urine cx growing ecoli  Goals of care -Palliative care was consulted, family members  Wishing to transition patient's care to comfort. IV antibiotics have been discontinued. Monitor closely for signs and symptoms of pain/agony.   Code Status: DNR, comfort care Family Communication:none at bedside Disposition Plan: Transition to comfort, guarded prognosis   Consultants:  Palliative   Antibiotics:  Primaxin, discontinuing of 04/17/2014  .   Objective: Filed Vitals:   04/18/14 1208  BP: 101/50  Pulse: 123  Temp: 98.8 F (37.1 C)  Resp: 22    Intake/Output Summary (Last 24 hours) at 04/18/14 1538 Last data filed at 04/18/14 1500  Gross per 24 hour  Intake    263 ml  Output   1450 ml  Net  -1187 ml   Filed Weights   28-Apr-2014 1226 Apr 28, 2014 1512 04/17/14 0400  Weight: 70 kg (154 lb 5.2 oz) 68.04 kg (150 lb) 60 kg (132 lb 4.4 oz)    Exam:   General:  Elderly male lying in bed responds with head movements to command  Cardiovascular: S1 and S2 tachycardic, no murmurs  Respiratory: Coarse respiratory sounds  Abdomen: Soft, nontender, nondistended  Musculoskeletal: warm,   CNS: responds to commands with head movement.  Nonverbal  Data Reviewed: Basic Metabolic Panel:  Recent Labs Lab Apr 28, 2014 1234 04/17/14 0145  NA 161* 161*  K 3.5* 3.8  CL 115* 124*  CO2 23 13*  GLUCOSE 273* 282*  BUN 78* 71*  CREATININE 2.60* 2.35*  CALCIUM 8.6 7.0*   Liver Function Tests:  Recent Labs Lab 04/04/2014 1234  AST 115*  ALT 59*  ALKPHOS 73  BILITOT 1.2  PROT 8.7*  ALBUMIN 2.5*   No results found for this basename: LIPASE, AMYLASE,  in the last 168 hours No results found for this  basename: AMMONIA,  in the last 168 hours CBC:  Recent Labs Lab 04/14/2014 1234 04/17/14 0145  WBC 18.3* 31.3*  NEUTROABS 13.5*  --   HGB 12.9* 9.8*  HCT 42.8 33.2*  MCV 97.7 100.3*  PLT 409* 213   Cardiac Enzymes:  Recent Labs Lab 04/17/14 0145  TROPONINI 0.31*   BNP (last 3 results) No results found for this basename: PROBNP,  in the last 8760 hours CBG:  Recent Labs Lab 03/21/2014 1812 04/14/2014 1956  GLUCAP 214* 204*    Recent Results (from the past 240 hour(s))  CULTURE, BLOOD (ROUTINE X 2)     Status: None   Collection Time    04/06/2014 12:39 PM      Result Value Ref Range Status   Specimen Description BLOOD RIGHT FOREARM   Final   Special Requests     Final   Value: BOTTLES DRAWN AEROBIC AND ANAEROBIC 5CCBLUE 10CCRED   Culture  Setup Time     Final   Value: 03/29/2014 16:56     Performed at [facility]   Culture     Final   Value: PROTEUS SPECIES     Note: Gram Stain Report Called to,Read Back By and Verified With: CAROLYN W. @ 8:45AM 5.1.15. BY DESIS     Performed at [facility]   Report Status PENDING   Incomplete  CULTURE, BLOOD (ROUTINE X 2)     Status: None   Collection Time    04/07/2014 12:55 PM      Result Value Ref Range Status   Specimen Description BLOOD RIGHT ANTECUBITAL   Final   Special Requests BOTTLES DRAWN AEROBIC AND ANAEROBIC [facility]   Final   Culture  Setup Time     Final   Value: 04/03/2014 18:20     Performed at [facility]   Culture     Final   Value: PROTEUS SPECIES     Note: Gram Stain Report Called to,Read Back By and Verified With:  CAROLYN W. @8 :45AM  5.1.15 BY DESIS     Performed at [facility]   Report Status PENDING   Incomplete  URINE CULTURE     Status: None   Collection Time    04/08/2014  1:50 PM      Result Value Ref Range Status   Specimen Description URINE, CLEAN CATCH   Final   Special Requests ADDED AT 2376 ON [id number]   Final   Culture  Setup Time     Final   Value: 03/28/2014  17:04     Performed at [place]     Final   Value: >=100,000 COLONIES/ML     Performed at [facility]   Culture     Final   Value: ESCHERICHIA COLI     Note: Confirmed Extended Spectrum Beta-Lactamase Producer (ESBL) CRITICAL RESULT CALLED TO, READ BACK BY AND VERIFIED WITH: JILL THOMPSON @ 5:14AM 5.2.15 BY DESIS     Performed at [facility]   Report Status 04/18/2014 FINAL   Final   Organism ID, Bacteria ESCHERICHIA COLI  Final  MRSA PCR SCREENING     Status: Abnormal   Collection Time    2014-05-03  6:35 PM      Result Value Ref Range Status   MRSA by PCR POSITIVE (*) NEGATIVE Final   Comment:            The GeneXpert MRSA Assay (FDA     approved for NASAL specimens     only), is one component of a     comprehensive MRSA colonization     surveillance program. It is not     intended to diagnose MRSA     infection nor to guide or     monitor treatment for     MRSA infections.     RESULT CALLED TO, READ BACK BY AND VERIFIED WITH:     L.VANCE,RN AT 2053 May 03, 2014 BY L.PITT     Studies: No results found.  Scheduled Meds: . sodium chloride  3 mL Intravenous Q12H   Continuous Infusions:     Time spent: 25 min    Davon Abdelaziz  [facility] Pager [phone] 7PM-7AM, please contact night-coverage at [email or web address], password [facility] 04/18/2014, 3:38 PM  LOS: 2 days

## 2014-04-18 NOTE — Consult Note (Signed)
I have reviewed this case with our NP and agree with the Assessment and Plan as stated.  Sharla Tankard L. Nalani Andreen, MD MBA The Palliative Medicine Team at [place] Team Phone: [phone] Pager: [phone]   

## 2014-04-19 ENCOUNTER — Encounter (HOSPITAL_COMMUNITY): Payer: Self-pay | Admitting: *Deleted

## 2014-04-19 DIAGNOSIS — Z029 Encounter for administrative examinations, unspecified: Secondary | ICD-10-CM

## 2014-04-20 LAB — CULTURE, BLOOD (ROUTINE X 2)

## 2014-04-26 NOTE — Discharge Summary (Signed)
Physician Discharge Summary  [place] GQQ:[id number] DOB: 1941/01/30 DOA: 2014-05-13  PCP: Default, Provider, MD  Admit date: 2014/05/13 Discharge date: 04/18/2014 Time spent: 30 minutes  Recommendations for Outpatient Follow-up:  1. Patient expired on 04/18/2014  Discharge Diagnoses:  Principal Problem:   Severe sepsis   Active Problems:   UTI (lower urinary tract infection)   Diabetes mellitus type 2 in nonobese   Protein-calorie malnutrition, severe   Hypernatremia   Dementia   Sacral decubitus ulcer   Acute renal failure   Palliative care encounter   Dyspnea   Pain, generalized     Filed Weights   2014/05/13 1512 04/17/14 0400 05/06/2014 0300  Weight: 68.04 kg (150 lb) 60 kg (132 lb 4.4 oz) 60 kg (132 lb 4.4 oz)    History of present illness:  73 year old time and with a past medical history of advanced dementia, recurrent ESBL Escherichia coli, admitted to the medicine service on 05/13/14. Patient was found to have a steep functional decline associated with fevers and hypotension at a skilled nursing facility. CODE STATUS reviewed with family members on admission, he is a DO NOT RESUSCITATE. He was admitted to the step down unit start her on broad-spectrum IV antimicrobial therapy. Patient remaining hypotensive overnight. Family members were notified and did not wish to pursue IV pressors or transferred to the intensive care unit. By this morning patient remained hypotensive, palliative care was consulted. Goals of care discussed with family members as focus of his care is on comfort rather than pursuing further invasive or burdensome interventions.      Hospital Course:   Severe sepsis  -Present on admission, with hypotension with systolic blood pressures in the 60s, heart rates in the 150s, temperature of 103.1  -Likely source of infection is UTI patient has a fetid UTIs  -Was initially started on empiric IV antibiotic therapy with IV Primaxin, and aggressive IV  fluid resuscitation.  -Patient clinically worsening and has increased lethargy. Lab work showing an upward trend in his white count to 31.3 as of lactic acid increased to 6.9 from 3.32.  -Family members expressing desire to focus his care on comfort. IV fluids and IV antibiotics have been discontinued per family members request.  -He was made [place] now. Patient placed on IV morphine 1 mg every 2 hours when necessary for pain and/or dyspnea and when necessary Ativan for anxiety.  patient expired on 04/18/2014.  Marland Kitchen Acute encephalopathy  -Patient presenting with functional decline, progressively poorly responsive likely due to sepsis.  made [facility]   acute renal failure.  -Presented with a creatinine of 2.6. Prior to this lab work on 02/17/2014 show a BUN and creatinine of 13 and 0.76.  -Likely secondary to severe sepsis   Hyponatremia.  sodium of 161 on admission secondary to profound dehydration   Urinary tract infection  Urine cx growing ecoli   Goals of care  -Palliative care was consulted, family members Wishing to transition patient's care to comfort. IV antibiotics  discontinued.   Patient expired on 04/18/2014        Consultants:  Palliative care   Antibiotics:  Primaxin, discontinuing of 04/17/2014  Discharge Exam: Filed Vitals:   04/18/14 1851  BP:   Pulse:   Temp: 103.1 F (39.5 C)  Resp:      Discharge Instructions You were cared for by a hospitalist during your hospital stay. If you have any questions about your discharge medications or the care you received while you were in the  hospital after you are discharged, you can call the unit and asked to speak with the hospitalist on call if the hospitalist that took care of you is not available. Once you are discharged, your primary care physician will handle any further medical issues. Please note that NO REFILLS for any discharge medications will be authorized once you are discharged, as it is imperative  that you return to your primary care physician (or establish a relationship with a primary care physician if you do not have one) for your aftercare needs so that they can reassess your need for medications and monitor your lab values.                                                           No Known Allergies    The results of significant diagnostics from this hospitalization (including imaging, microbiology, ancillary and laboratory) are listed below for reference.    Significant Diagnostic Studies: Dg Chest Port 1 View  04/14/2014   CLINICAL DATA:  Sepsis  EXAM: PORTABLE CHEST - 1 VIEW  COMPARISON:  02/11/2014  FINDINGS: Interval removal of left central venous line. Stable cardiac silhouette. No effusion, infiltrate, or pneumothorax.  IMPRESSION: No acute cardiopulmonary process.   Electronically Signed   By: Suzy Bouchard M.D.   On: 04/07/2014 13:29    Microbiology: Recent Results (from the past 240 hour(s))  MRSA PCR SCREENING     Status: Abnormal   Collection Time    04/02/2014  6:35 PM      Result Value Ref Range Status   MRSA by PCR POSITIVE (*) NEGATIVE Final   Comment:            The GeneXpert MRSA Assay (FDA     approved for NASAL specimens     only), is one component of a     comprehensive MRSA colonization     surveillance program. It is not     intended to diagnose MRSA     infection nor to guide or     monitor treatment for     MRSA infections.     RESULT CALLED TO, READ BACK BY AND VERIFIED WITH:     L.VANCE,RN AT 2053 04/08/2014 BY L.PITT     Labs: Basic Metabolic Panel: No results found for this basename: NA, K, CL, CO2, GLUCOSE, BUN, CREATININE, CALCIUM, MG, PHOS,  in the last 168 hours Liver Function Tests: No results found for this basename: AST, ALT, ALKPHOS, BILITOT, PROT, ALBUMIN,  in the last 168 hours No results found for this basename: LIPASE, AMYLASE,  in the last 168 hours No results found for this basename: AMMONIA,  in the  last 168 hours CBC: No results found for this basename: WBC, NEUTROABS, HGB, HCT, MCV, PLT,  in the last 168 hours Cardiac Enzymes: No results found for this basename: CKTOTAL, CKMB, CKMBINDEX, TROPONINI,  in the last 168 hours BNP: BNP (last 3 results) No results found for this basename: PROBNP,  in the last 8760 hours CBG: No results found for this basename: GLUCAP,  in the last 168 hours     Signed:  Rucker Pridgeon  [facility] 04/26/2014, 3:12 PM

## 2014-05-18 DEATH — deceased

## 2014-07-07 IMAGING — CR DG CHEST 1V PORT
1 series · 1 of 1 positions shown · non-contrast
Comparison: 02/11/2014

CLINICAL DATA: Sepsis

EXAM:
PORTABLE CHEST - 1 VIEW

[AP]
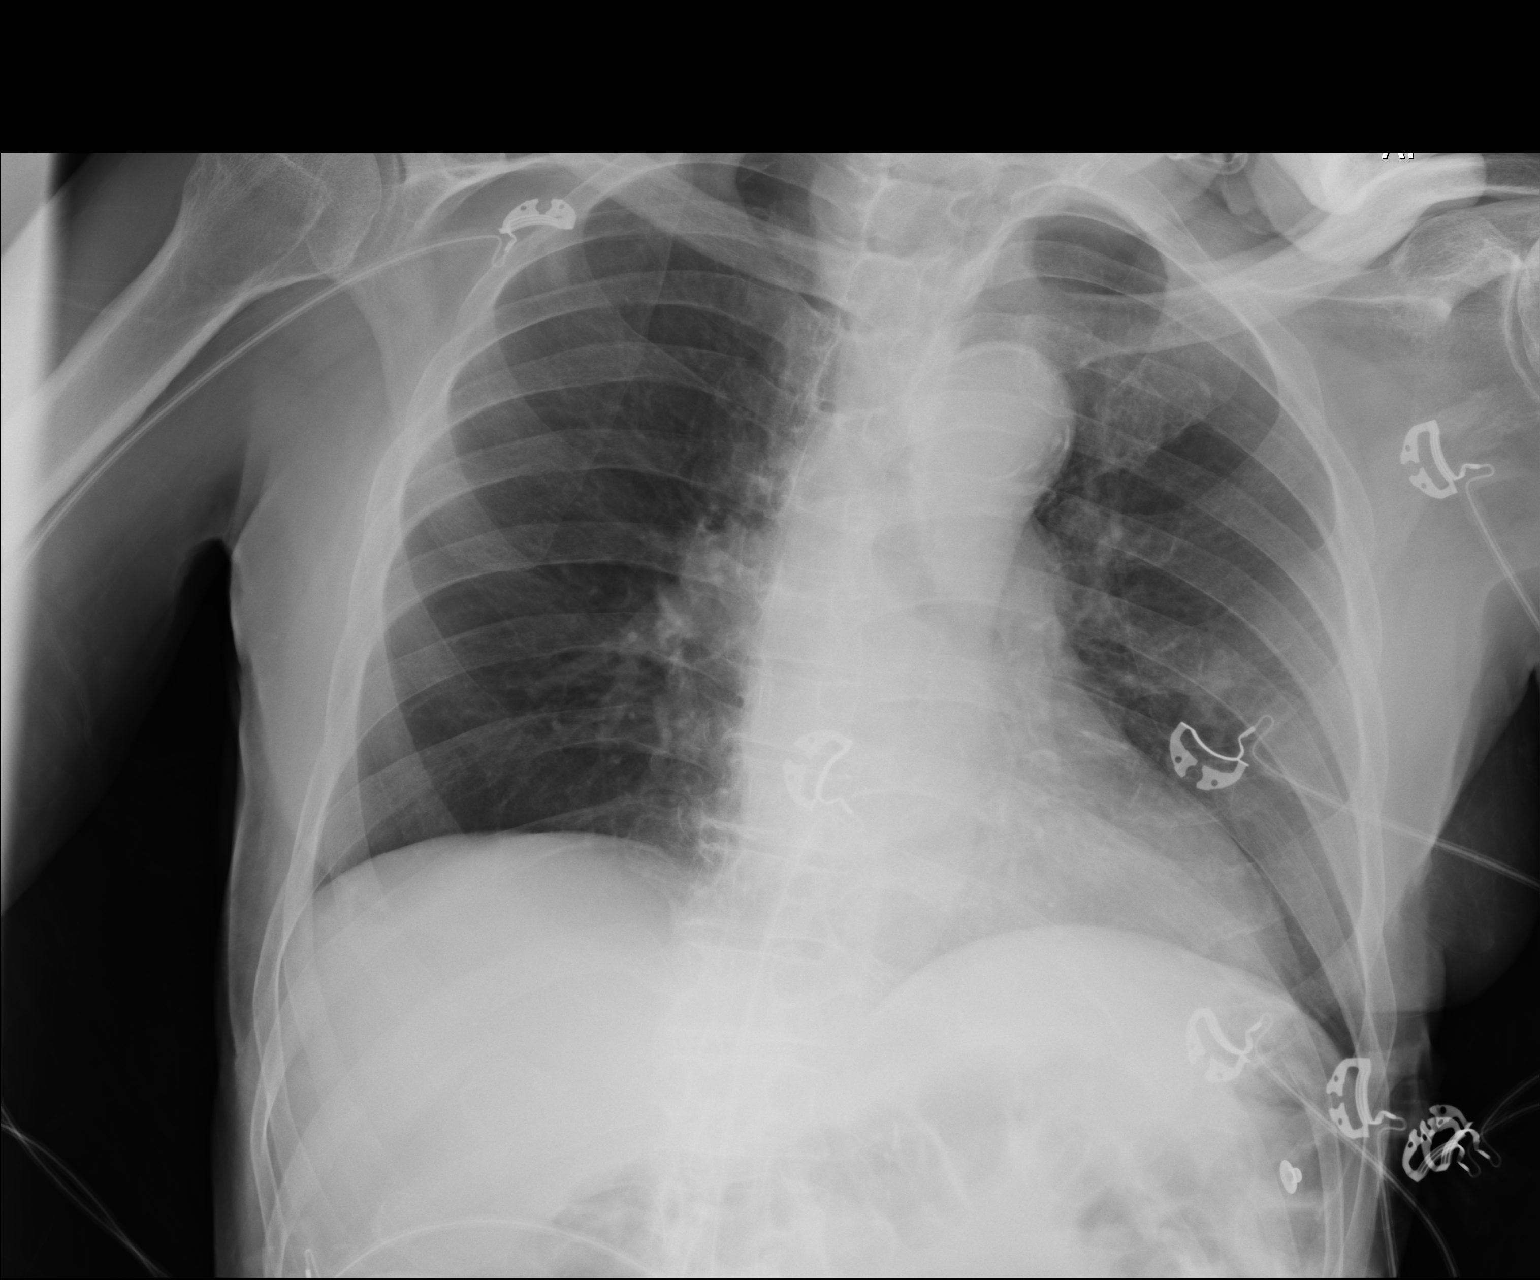

[1 of 1 positions shown; findings below may reference images not displayed]

FINDINGS: Interval removal of left central venous line. Stable cardiac
silhouette. No effusion, infiltrate, or pneumothorax.
IMPRESSION: No acute cardiopulmonary process.
